# Patient Record
Sex: Male | Born: 1935 | Race: White | Hispanic: No | Marital: Single | Smoking: Never smoker
Health system: Southern US, Community
[De-identification: ages and names within clinical notes are randomized; demographics above are authoritative.]

## PROBLEM LIST (undated history)

## (undated) DIAGNOSIS — C801 Malignant (primary) neoplasm, unspecified: Secondary | ICD-10-CM

## (undated) DIAGNOSIS — H409 Unspecified glaucoma: Secondary | ICD-10-CM

## (undated) DIAGNOSIS — I10 Essential (primary) hypertension: Secondary | ICD-10-CM

## (undated) DIAGNOSIS — E119 Type 2 diabetes mellitus without complications: Secondary | ICD-10-CM

## (undated) HISTORY — PX: COSMETIC SURGERY: SHX468

---

## 2012-06-30 LAB — URINALYSIS, COMPLETE
Bilirubin,UR: NEGATIVE
Glucose,UR: NEGATIVE mg/dL (ref 0–75)
Leukocyte Esterase: NEGATIVE
Ph: 5 (ref 4.5–8.0)
Protein: NEGATIVE
Specific Gravity: 1.019 (ref 1.003–1.030)

## 2012-06-30 LAB — COMPREHENSIVE METABOLIC PANEL
Albumin: 3.3 g/dL — ABNORMAL LOW (ref 3.4–5.0)
Anion Gap: 4 — ABNORMAL LOW (ref 7–16)
Bilirubin,Total: 0.4 mg/dL (ref 0.2–1.0)
Calcium, Total: 8.4 mg/dL — ABNORMAL LOW (ref 8.5–10.1)
Chloride: 107 mmol/L (ref 98–107)
Co2: 26 mmol/L (ref 21–32)
EGFR (Non-African Amer.): >60
SGOT(AST): 18 U/L (ref 15–37)
Sodium: 137 mmol/L (ref 136–145)
Total Protein: 8.4 g/dL — ABNORMAL HIGH (ref 6.4–8.2)

## 2012-06-30 LAB — CBC
HGB: 10.2 g/dL — ABNORMAL LOW (ref 13.0–18.0)
MCH: 21.8 pg — ABNORMAL LOW (ref 26.0–34.0)
MCHC: 31.4 g/dL — ABNORMAL LOW (ref 32.0–36.0)
MCV: 69 fL — ABNORMAL LOW (ref 80–100)
RBC: 4.67 10*6/uL (ref 4.40–5.90)
RDW: 17.5 % — ABNORMAL HIGH (ref 11.5–14.5)
WBC: 9 10*3/uL (ref 3.8–10.6)

## 2012-06-30 LAB — CK TOTAL AND CKMB (NOT AT ARMC): CK, Total: 79 U/L (ref 35–232)

## 2012-06-30 LAB — PRO B NATRIURETIC PEPTIDE: B-Type Natriuretic Peptide: 1511 pg/mL — ABNORMAL HIGH (ref 0–450)

## 2012-06-30 LAB — TROPONIN I: Troponin-I: <0.02 ng/mL

## 2012-07-01 ENCOUNTER — Inpatient Hospital Stay: Payer: Self-pay | Admitting: Internal Medicine

## 2012-07-01 LAB — CBC WITH DIFFERENTIAL/PLATELET
Basophil %: 0.6 %
Eosinophil #: 0.1 10*3/uL (ref 0.0–0.7)
Eosinophil %: 1.2 %
HCT: 27.6 % — ABNORMAL LOW (ref 40.0–52.0)
HGB: 9.5 g/dL — ABNORMAL LOW (ref 13.0–18.0)
Lymphocyte #: 0.6 10*3/uL — ABNORMAL LOW (ref 1.0–3.6)
MCH: 23.7 pg — ABNORMAL LOW (ref 26.0–34.0)
MCHC: 34.4 g/dL (ref 32.0–36.0)
MCV: 69 fL — ABNORMAL LOW (ref 80–100)
Monocyte #: 0.8 x10 3/mm (ref 0.2–1.0)
Monocyte %: 9.2 %
Neutrophil #: 7.5 10*3/uL — ABNORMAL HIGH (ref 1.4–6.5)
Neutrophil %: 82.2 %
RBC: 4 10*6/uL — ABNORMAL LOW (ref 4.40–5.90)
RDW: 17.4 % — ABNORMAL HIGH (ref 11.5–14.5)

## 2012-07-01 LAB — BASIC METABOLIC PANEL
Anion Gap: 4 — ABNORMAL LOW (ref 7–16)
BUN: 13 mg/dL (ref 7–18)
Calcium, Total: 8.3 mg/dL — ABNORMAL LOW (ref 8.5–10.1)
Chloride: 106 mmol/L (ref 98–107)
Glucose: 142 mg/dL — ABNORMAL HIGH (ref 65–99)
Osmolality: 280 (ref 275–301)

## 2012-07-04 LAB — COMPREHENSIVE METABOLIC PANEL
Albumin: 2.4 g/dL — ABNORMAL LOW (ref 3.4–5.0)
Alkaline Phosphatase: 61 U/L (ref 50–136)
Bilirubin,Total: 0.2 mg/dL (ref 0.2–1.0)
Calcium, Total: 7.9 mg/dL — ABNORMAL LOW (ref 8.5–10.1)
Co2: 27 mmol/L (ref 21–32)
Creatinine: 0.71 mg/dL (ref 0.60–1.30)
EGFR (Non-African Amer.): >60
Potassium: 3.5 mmol/L (ref 3.5–5.1)
SGOT(AST): 14 U/L — ABNORMAL LOW (ref 15–37)
SGPT (ALT): 19 U/L (ref 12–78)
Sodium: 136 mmol/L (ref 136–145)
Total Protein: 6.9 g/dL (ref 6.4–8.2)

## 2012-07-04 LAB — CBC WITH DIFFERENTIAL/PLATELET
Basophil %: 0.7 %
Eosinophil #: 0.1 10*3/uL (ref 0.0–0.7)
Eosinophil %: 1.2 %
HGB: 8.4 g/dL — ABNORMAL LOW (ref 13.0–18.0)
Lymphocyte #: 0.8 10*3/uL — ABNORMAL LOW (ref 1.0–3.6)
Lymphocyte %: 8.8 %
MCV: 69 fL — ABNORMAL LOW (ref 80–100)
Monocyte %: 10.8 %
Neutrophil %: 78.5 %
Platelet: 176 10*3/uL (ref 150–440)
RBC: 3.93 10*6/uL — ABNORMAL LOW (ref 4.40–5.90)
RDW: 17.1 % — ABNORMAL HIGH (ref 11.5–14.5)
WBC: 9.2 10*3/uL (ref 3.8–10.6)

## 2012-07-05 LAB — CBC WITH DIFFERENTIAL/PLATELET
Basophil #: 0.1 10*3/uL (ref 0.0–0.1)
Basophil %: 1 %
Eosinophil #: 0 10*3/uL (ref 0.0–0.7)
Eosinophil %: 0.1 %
Lymphocyte %: 3 %
MCH: 21.5 pg — ABNORMAL LOW (ref 26.0–34.0)
MCHC: 31.5 g/dL — ABNORMAL LOW (ref 32.0–36.0)
MCV: 68 fL — ABNORMAL LOW (ref 80–100)
Monocyte #: 0.2 x10 3/mm (ref 0.2–1.0)
Monocyte %: 3.6 %
Neutrophil #: 5.9 10*3/uL (ref 1.4–6.5)
Neutrophil %: 92.3 %
Platelet: 200 10*3/uL (ref 150–440)
RBC: 4.28 10*6/uL — ABNORMAL LOW (ref 4.40–5.90)
WBC: 6.4 10*3/uL (ref 3.8–10.6)

## 2012-07-05 LAB — BASIC METABOLIC PANEL
Calcium, Total: 8.8 mg/dL (ref 8.5–10.1)
Chloride: 103 mmol/L (ref 98–107)
Creatinine: 0.74 mg/dL (ref 0.60–1.30)
EGFR (African American): >60
EGFR (Non-African Amer.): >60
Sodium: 137 mmol/L (ref 136–145)

## 2012-07-05 LAB — IRON AND TIBC: Unbound Iron-Bind.Cap.: 319 ug/dL

## 2012-07-06 LAB — CBC WITH DIFFERENTIAL/PLATELET
Basophil #: 0 10*3/uL (ref 0.0–0.1)
Basophil %: 0.3 %
Eosinophil #: 0 10*3/uL (ref 0.0–0.7)
Eosinophil %: 0.1 %
HCT: 29 % — ABNORMAL LOW (ref 40.0–52.0)
Lymphocyte %: 2.3 %
MCV: 69 fL — ABNORMAL LOW (ref 80–100)
Monocyte %: 5.6 %
Neutrophil #: 10 10*3/uL — ABNORMAL HIGH (ref 1.4–6.5)
Neutrophil %: 91.7 %
RBC: 4.23 10*6/uL — ABNORMAL LOW (ref 4.40–5.90)
RDW: 17.4 % — ABNORMAL HIGH (ref 11.5–14.5)
WBC: 10.9 10*3/uL — ABNORMAL HIGH (ref 3.8–10.6)

## 2012-07-06 LAB — BASIC METABOLIC PANEL
BUN: 21 mg/dL — ABNORMAL HIGH (ref 7–18)
Chloride: 105 mmol/L (ref 98–107)
Creatinine: 0.83 mg/dL (ref 0.60–1.30)
EGFR (African American): >60
EGFR (Non-African Amer.): >60
Potassium: 4 mmol/L (ref 3.5–5.1)

## 2012-07-07 LAB — BASIC METABOLIC PANEL
Anion Gap: 2 — ABNORMAL LOW (ref 7–16)
BUN: 20 mg/dL — ABNORMAL HIGH (ref 7–18)
Calcium, Total: 8.4 mg/dL — ABNORMAL LOW (ref 8.5–10.1)
Chloride: 104 mmol/L (ref 98–107)
Creatinine: 0.83 mg/dL (ref 0.60–1.30)
EGFR (Non-African Amer.): >60
Sodium: 139 mmol/L (ref 136–145)

## 2012-07-07 LAB — CBC WITH DIFFERENTIAL/PLATELET
Basophil #: 0 10*3/uL (ref 0.0–0.1)
Basophil %: 0.2 %
Eosinophil #: 0 10*3/uL (ref 0.0–0.7)
HGB: 8.6 g/dL — ABNORMAL LOW (ref 13.0–18.0)
MCH: 21.1 pg — ABNORMAL LOW (ref 26.0–34.0)
MCHC: 30.9 g/dL — ABNORMAL LOW (ref 32.0–36.0)
Monocyte #: 0.4 x10 3/mm (ref 0.2–1.0)
Monocyte %: 4.1 %
Neutrophil #: 9.8 10*3/uL — ABNORMAL HIGH (ref 1.4–6.5)
Neutrophil %: 93 %
Platelet: 269 10*3/uL (ref 150–440)
RDW: 16.7 % — ABNORMAL HIGH (ref 11.5–14.5)
WBC: 10.6 10*3/uL (ref 3.8–10.6)

## 2012-07-08 LAB — COMPREHENSIVE METABOLIC PANEL
Albumin: 2.4 g/dL — ABNORMAL LOW (ref 3.4–5.0)
Bilirubin,Total: 0.2 mg/dL (ref 0.2–1.0)
Calcium, Total: 8.3 mg/dL — ABNORMAL LOW (ref 8.5–10.1)
Co2: 34 mmol/L — ABNORMAL HIGH (ref 21–32)
Creatinine: 0.78 mg/dL (ref 0.60–1.30)
EGFR (African American): >60
Glucose: 227 mg/dL — ABNORMAL HIGH (ref 65–99)
Potassium: 3.9 mmol/L (ref 3.5–5.1)
SGOT(AST): 26 U/L (ref 15–37)
SGPT (ALT): 69 U/L (ref 12–78)
Sodium: 138 mmol/L (ref 136–145)
Total Protein: 6.5 g/dL (ref 6.4–8.2)

## 2012-07-08 LAB — IRON AND TIBC
Iron Bind.Cap.(Total): 325 ug/dL (ref 250–450)
Iron Saturation: 9 %

## 2012-07-08 LAB — CBC WITH DIFFERENTIAL/PLATELET
HCT: 27.8 % — ABNORMAL LOW (ref 40.0–52.0)
HGB: 8.8 g/dL — ABNORMAL LOW (ref 13.0–18.0)
Lymphocytes: 14 %
MCH: 21.4 pg — ABNORMAL LOW (ref 26.0–34.0)
MCV: 68 fL — ABNORMAL LOW (ref 80–100)
Platelet: 265 10*3/uL (ref 150–440)
RDW: 17.4 % — ABNORMAL HIGH (ref 11.5–14.5)
Segmented Neutrophils: 82 %
Variant Lymphocyte - H1-Rlymph: 1 %
WBC: 9.3 10*3/uL (ref 3.8–10.6)

## 2012-07-08 LAB — OCCULT BLOOD X 1 CARD TO LAB, STOOL: Occult Blood, Feces: NEGATIVE

## 2012-07-09 LAB — BASIC METABOLIC PANEL
Anion Gap: 4 — ABNORMAL LOW (ref 7–16)
BUN: 17 mg/dL (ref 7–18)
Calcium, Total: 8 mg/dL — ABNORMAL LOW (ref 8.5–10.1)
Co2: 32 mmol/L (ref 21–32)
Creatinine: 0.78 mg/dL (ref 0.60–1.30)
EGFR (African American): >60
EGFR (Non-African Amer.): >60
Glucose: 87 mg/dL (ref 65–99)
Osmolality: 277 (ref 275–301)
Sodium: 138 mmol/L (ref 136–145)

## 2012-07-10 LAB — UR PROT ELECTROPHORESIS, URINE RANDOM

## 2012-09-29 ENCOUNTER — Ambulatory Visit: Payer: Self-pay | Admitting: Internal Medicine

## 2012-11-21 ENCOUNTER — Ambulatory Visit: Payer: Self-pay | Admitting: Specialist

## 2012-12-04 ENCOUNTER — Ambulatory Visit: Payer: Self-pay | Admitting: Specialist

## 2012-12-11 ENCOUNTER — Ambulatory Visit: Payer: Self-pay | Admitting: Unknown Physician Specialty

## 2012-12-12 ENCOUNTER — Ambulatory Visit: Payer: Self-pay | Admitting: Unknown Physician Specialty

## 2012-12-13 ENCOUNTER — Ambulatory Visit: Payer: Self-pay | Admitting: Oncology

## 2012-12-13 LAB — PATHOLOGY REPORT

## 2012-12-25 ENCOUNTER — Ambulatory Visit: Payer: Self-pay | Admitting: Oncology

## 2013-01-08 LAB — CBC CANCER CENTER
Basophil %: 1.2 %
Eosinophil %: 12.1 %
HGB: 12.6 g/dL — ABNORMAL LOW (ref 13.0–18.0)
Lymphocyte %: 21.4 %
MCH: 28.1 pg (ref 26.0–34.0)
Monocyte #: 1 x10 3/mm (ref 0.2–1.0)
Monocyte %: 9.4 %
Neutrophil #: 5.9 x10 3/mm (ref 1.4–6.5)
Neutrophil %: 55.9 %
Platelet: 197 x10 3/mm (ref 150–440)
RDW: 14.3 % (ref 11.5–14.5)

## 2013-01-08 LAB — BASIC METABOLIC PANEL
Chloride: 105 mmol/L (ref 98–107)
Co2: 28 mmol/L (ref 21–32)
Creatinine: 1.15 mg/dL (ref 0.60–1.30)
EGFR (African American): >60
Glucose: 174 mg/dL — ABNORMAL HIGH (ref 65–99)
Osmolality: 288 (ref 275–301)

## 2013-01-08 LAB — MAGNESIUM: Magnesium: 2.3 mg/dL

## 2013-01-15 LAB — CBC CANCER CENTER
Eosinophil #: 1.2 x10 3/mm — ABNORMAL HIGH (ref 0.0–0.7)
Eosinophil %: 10.9 %
HCT: 40.6 % (ref 40.0–52.0)
HGB: 12.9 g/dL — ABNORMAL LOW (ref 13.0–18.0)
Lymphocyte #: 1.6 x10 3/mm (ref 1.0–3.6)
Lymphocyte %: 14.7 %
MCH: 27.7 pg (ref 26.0–34.0)
MCHC: 31.9 g/dL — ABNORMAL LOW (ref 32.0–36.0)
MCV: 87 fL (ref 80–100)
Monocyte #: 1.2 x10 3/mm — ABNORMAL HIGH (ref 0.2–1.0)
Neutrophil %: 62.7 %
Platelet: 260 x10 3/mm (ref 150–440)
RBC: 4.68 10*6/uL (ref 4.40–5.90)
RDW: 14.4 % (ref 11.5–14.5)
WBC: 11.2 x10 3/mm — ABNORMAL HIGH (ref 3.8–10.6)

## 2013-01-22 LAB — COMPREHENSIVE METABOLIC PANEL
Albumin: 3 g/dL — ABNORMAL LOW (ref 3.4–5.0)
Alkaline Phosphatase: 100 U/L (ref 50–136)
Anion Gap: 10 (ref 7–16)
BUN: 14 mg/dL (ref 7–18)
Bilirubin,Total: 0.2 mg/dL (ref 0.2–1.0)
Calcium, Total: 8.4 mg/dL — ABNORMAL LOW (ref 8.5–10.1)
Chloride: 105 mmol/L (ref 98–107)
EGFR (African American): >60
EGFR (Non-African Amer.): 59 — ABNORMAL LOW
Glucose: 134 mg/dL — ABNORMAL HIGH (ref 65–99)
Osmolality: 286 (ref 275–301)
Potassium: 3.6 mmol/L (ref 3.5–5.1)
SGOT(AST): 11 U/L — ABNORMAL LOW (ref 15–37)
SGPT (ALT): 23 U/L (ref 12–78)
Sodium: 142 mmol/L (ref 136–145)

## 2013-01-22 LAB — CBC CANCER CENTER
Basophil #: 0.1 x10 3/mm (ref 0.0–0.1)
Eosinophil #: 1.1 x10 3/mm — ABNORMAL HIGH (ref 0.0–0.7)
Lymphocyte %: 13.3 %
MCH: 27.7 pg (ref 26.0–34.0)
MCHC: 32.1 g/dL (ref 32.0–36.0)
MCV: 86 fL (ref 80–100)
Monocyte %: 8.6 %
Neutrophil #: 8 x10 3/mm — ABNORMAL HIGH (ref 1.4–6.5)
Neutrophil %: 68.3 %
RBC: 4.65 10*6/uL (ref 4.40–5.90)
WBC: 11.7 x10 3/mm — ABNORMAL HIGH (ref 3.8–10.6)

## 2013-01-24 ENCOUNTER — Ambulatory Visit: Payer: Self-pay | Admitting: Oncology

## 2013-02-06 LAB — MAGNESIUM: Magnesium: 1.8 mg/dL

## 2013-02-06 LAB — CBC CANCER CENTER
Basophil #: 0.1 x10 3/mm (ref 0.0–0.1)
Basophil %: 1.1 %
HCT: 40.4 % (ref 40.0–52.0)
MCH: 28.7 pg (ref 26.0–34.0)
Monocyte %: 8.8 %
Neutrophil #: 6.8 x10 3/mm — ABNORMAL HIGH (ref 1.4–6.5)
RBC: 4.65 10*6/uL (ref 4.40–5.90)
RDW: 14.2 % (ref 11.5–14.5)

## 2013-02-06 LAB — COMPREHENSIVE METABOLIC PANEL
Albumin: 2.9 g/dL — ABNORMAL LOW (ref 3.4–5.0)
Alkaline Phosphatase: 131 U/L (ref 50–136)
BUN: 13 mg/dL (ref 7–18)
Chloride: 104 mmol/L (ref 98–107)
Glucose: 211 mg/dL — ABNORMAL HIGH (ref 65–99)
Osmolality: 288 (ref 275–301)
Potassium: 3.7 mmol/L (ref 3.5–5.1)
SGOT(AST): 12 U/L — ABNORMAL LOW (ref 15–37)
SGPT (ALT): 19 U/L (ref 12–78)
Total Protein: 7.3 g/dL (ref 6.4–8.2)

## 2013-02-24 ENCOUNTER — Ambulatory Visit: Payer: Self-pay | Admitting: Oncology

## 2013-03-14 LAB — CBC CANCER CENTER
Basophil #: 0.1 x10 3/mm (ref 0.0–0.1)
Eosinophil #: 1.1 x10 3/mm — ABNORMAL HIGH (ref 0.0–0.7)
Eosinophil %: 13.7 %
HCT: 42.9 % (ref 40.0–52.0)
HGB: 13.8 g/dL (ref 13.0–18.0)
Lymphocyte #: 1 x10 3/mm (ref 1.0–3.6)
Lymphocyte %: 12.2 %
MCH: 27.5 pg (ref 26.0–34.0)
MCHC: 32.2 g/dL (ref 32.0–36.0)
Monocyte %: 8.3 %
Neutrophil #: 5.2 x10 3/mm (ref 1.4–6.5)
Neutrophil %: 64.5 %
Platelet: 203 x10 3/mm (ref 150–440)
RBC: 5.03 10*6/uL (ref 4.40–5.90)
WBC: 8.1 x10 3/mm (ref 3.8–10.6)

## 2013-03-14 LAB — COMPREHENSIVE METABOLIC PANEL
Albumin: 3 g/dL — ABNORMAL LOW (ref 3.4–5.0)
BUN: 11 mg/dL (ref 7–18)
Bilirubin,Total: 0.2 mg/dL (ref 0.2–1.0)
Calcium, Total: 8.9 mg/dL (ref 8.5–10.1)
Chloride: 104 mmol/L (ref 98–107)
Creatinine: 1.18 mg/dL (ref 0.60–1.30)
EGFR (African American): >60
EGFR (Non-African Amer.): 56 — ABNORMAL LOW
Osmolality: 284 (ref 275–301)
Potassium: 3.8 mmol/L (ref 3.5–5.1)
SGOT(AST): 13 U/L — ABNORMAL LOW (ref 15–37)
SGPT (ALT): 20 U/L (ref 12–78)
Sodium: 140 mmol/L (ref 136–145)
Total Protein: 7.4 g/dL (ref 6.4–8.2)

## 2013-03-14 LAB — MAGNESIUM: Magnesium: 2 mg/dL

## 2013-03-26 ENCOUNTER — Ambulatory Visit: Payer: Self-pay | Admitting: Oncology

## 2013-04-30 ENCOUNTER — Ambulatory Visit: Payer: Self-pay | Admitting: Oncology

## 2013-05-02 ENCOUNTER — Ambulatory Visit: Payer: Self-pay | Admitting: Oncology

## 2013-05-02 LAB — COMPREHENSIVE METABOLIC PANEL
ANION GAP: 12 (ref 7–16)
AST: 14 U/L — AB (ref 15–37)
Albumin: 3.6 g/dL (ref 3.4–5.0)
Alkaline Phosphatase: 86 U/L
BILIRUBIN TOTAL: 0.4 mg/dL (ref 0.2–1.0)
BUN: 14 mg/dL (ref 7–18)
Calcium, Total: 9.3 mg/dL (ref 8.5–10.1)
Chloride: 103 mmol/L (ref 98–107)
Co2: 24 mmol/L (ref 21–32)
Creatinine: 1.2 mg/dL (ref 0.60–1.30)
EGFR (African American): >60
GFR CALC NON AF AMER: 54 — AB
GLUCOSE: 190 mg/dL — AB (ref 65–99)
Osmolality: 283 (ref 275–301)
Potassium: 3.8 mmol/L (ref 3.5–5.1)
SGPT (ALT): 26 U/L (ref 12–78)
SODIUM: 139 mmol/L (ref 136–145)
Total Protein: 8 g/dL (ref 6.4–8.2)

## 2013-05-02 LAB — TSH: Thyroid Stimulating Horm: 3.29 u[IU]/mL

## 2013-05-02 LAB — CBC CANCER CENTER
BASOS ABS: 0.1 x10 3/mm (ref 0.0–0.1)
Basophil %: 1.4 %
EOS ABS: 1 x10 3/mm — AB (ref 0.0–0.7)
EOS PCT: 12.6 %
HCT: 45.5 % (ref 40.0–52.0)
HGB: 14.6 g/dL (ref 13.0–18.0)
LYMPHS ABS: 1.4 x10 3/mm (ref 1.0–3.6)
Lymphocyte %: 16.9 %
MCH: 27.5 pg (ref 26.0–34.0)
MCHC: 32 g/dL (ref 32.0–36.0)
MCV: 86 fL (ref 80–100)
Monocyte #: 0.6 x10 3/mm (ref 0.2–1.0)
Monocyte %: 7.2 %
NEUTROS ABS: 5 x10 3/mm (ref 1.4–6.5)
Neutrophil %: 61.9 %
Platelet: 187 x10 3/mm (ref 150–440)
RBC: 5.3 10*6/uL (ref 4.40–5.90)
RDW: 15.9 % — ABNORMAL HIGH (ref 11.5–14.5)
WBC: 8.1 x10 3/mm (ref 3.8–10.6)

## 2013-05-27 ENCOUNTER — Ambulatory Visit: Payer: Self-pay | Admitting: Oncology

## 2013-08-31 ENCOUNTER — Ambulatory Visit: Payer: Self-pay | Admitting: Oncology

## 2013-10-31 ENCOUNTER — Ambulatory Visit: Payer: Self-pay | Admitting: Hematology and Oncology

## 2013-11-07 LAB — CBC CANCER CENTER
BASOS ABS: 0.1 x10 3/mm (ref 0.0–0.1)
Basophil %: 0.9 %
EOS ABS: 1 x10 3/mm — AB (ref 0.0–0.7)
EOS PCT: 12.5 %
HCT: 40.6 % (ref 40.0–52.0)
HGB: 13.2 g/dL (ref 13.0–18.0)
LYMPHS PCT: 12.8 %
Lymphocyte #: 1.1 x10 3/mm (ref 1.0–3.6)
MCH: 28.9 pg (ref 26.0–34.0)
MCHC: 32.5 g/dL (ref 32.0–36.0)
MCV: 89 fL (ref 80–100)
Monocyte #: 0.7 x10 3/mm (ref 0.2–1.0)
Monocyte %: 7.8 %
NEUTROS ABS: 5.5 x10 3/mm (ref 1.4–6.5)
NEUTROS PCT: 66 %
PLATELETS: 230 x10 3/mm (ref 150–440)
RBC: 4.58 10*6/uL (ref 4.40–5.90)
RDW: 13.8 % (ref 11.5–14.5)
WBC: 8.3 x10 3/mm (ref 3.8–10.6)

## 2013-11-07 LAB — COMPREHENSIVE METABOLIC PANEL
ALBUMIN: 3.3 g/dL — AB (ref 3.4–5.0)
Alkaline Phosphatase: 69 U/L
Anion Gap: 10 (ref 7–16)
BILIRUBIN TOTAL: 0.3 mg/dL (ref 0.2–1.0)
BUN: 20 mg/dL — ABNORMAL HIGH (ref 7–18)
CHLORIDE: 107 mmol/L (ref 98–107)
Calcium, Total: 9 mg/dL (ref 8.5–10.1)
Co2: 26 mmol/L (ref 21–32)
Creatinine: 1.36 mg/dL — ABNORMAL HIGH (ref 0.60–1.30)
EGFR (African American): 54 — ABNORMAL LOW
EGFR (Non-African Amer.): 47 — ABNORMAL LOW
Glucose: 155 mg/dL — ABNORMAL HIGH (ref 65–99)
Osmolality: 291 (ref 275–301)
Potassium: 4.1 mmol/L (ref 3.5–5.1)
SGOT(AST): 17 U/L (ref 15–37)
SGPT (ALT): 23 U/L (ref 12–78)
SODIUM: 143 mmol/L (ref 136–145)
TOTAL PROTEIN: 7.3 g/dL (ref 6.4–8.2)

## 2013-11-07 LAB — TSH: Thyroid Stimulating Horm: 4.87 u[IU]/mL — ABNORMAL HIGH

## 2013-11-24 ENCOUNTER — Ambulatory Visit: Payer: Self-pay | Admitting: Hematology and Oncology

## 2013-12-05 ENCOUNTER — Inpatient Hospital Stay (HOSPITAL_COMMUNITY)
Admission: EM | Admit: 2013-12-05 | Discharge: 2013-12-06 | DRG: 087 | Disposition: A | Discharge status: Home / Self Care | Payer: Medicare Other | Attending: Internal Medicine | Admitting: Internal Medicine

## 2013-12-05 ENCOUNTER — Encounter (HOSPITAL_COMMUNITY): Payer: Self-pay | Admitting: Emergency Medicine

## 2013-12-05 ENCOUNTER — Ambulatory Visit: Payer: Self-pay | Admitting: Internal Medicine

## 2013-12-05 DIAGNOSIS — E119 Type 2 diabetes mellitus without complications: Secondary | ICD-10-CM

## 2013-12-05 DIAGNOSIS — I10 Essential (primary) hypertension: Secondary | ICD-10-CM

## 2013-12-05 DIAGNOSIS — I6200 Nontraumatic subdural hemorrhage, unspecified: Secondary | ICD-10-CM

## 2013-12-05 DIAGNOSIS — Z79899 Other long term (current) drug therapy: Secondary | ICD-10-CM

## 2013-12-05 DIAGNOSIS — R269 Unspecified abnormalities of gait and mobility: Secondary | ICD-10-CM | POA: Diagnosis present

## 2013-12-05 DIAGNOSIS — S065X9A Traumatic subdural hemorrhage with loss of consciousness of unspecified duration, initial encounter: Secondary | ICD-10-CM

## 2013-12-05 DIAGNOSIS — Y92009 Unspecified place in unspecified non-institutional (private) residence as the place of occurrence of the external cause: Secondary | ICD-10-CM

## 2013-12-05 DIAGNOSIS — W19XXXA Unspecified fall, initial encounter: Secondary | ICD-10-CM

## 2013-12-05 DIAGNOSIS — W010XXA Fall on same level from slipping, tripping and stumbling without subsequent striking against object, initial encounter: Secondary | ICD-10-CM | POA: Diagnosis present

## 2013-12-05 DIAGNOSIS — S065XAA Traumatic subdural hemorrhage with loss of consciousness status unknown, initial encounter: Secondary | ICD-10-CM

## 2013-12-05 DIAGNOSIS — H409 Unspecified glaucoma: Secondary | ICD-10-CM

## 2013-12-05 DIAGNOSIS — Z66 Do not resuscitate: Secondary | ICD-10-CM | POA: Diagnosis present

## 2013-12-05 DIAGNOSIS — S065X0A Traumatic subdural hemorrhage without loss of consciousness, initial encounter: Principal | ICD-10-CM | POA: Diagnosis present

## 2013-12-05 DIAGNOSIS — E1139 Type 2 diabetes mellitus with other diabetic ophthalmic complication: Secondary | ICD-10-CM

## 2013-12-05 DIAGNOSIS — R51 Headache: Secondary | ICD-10-CM | POA: Diagnosis present

## 2013-12-05 HISTORY — DX: Unspecified glaucoma: H40.9

## 2013-12-05 HISTORY — DX: Type 2 diabetes mellitus without complications: E11.9

## 2013-12-05 HISTORY — DX: Essential (primary) hypertension: I10

## 2013-12-05 LAB — COMPREHENSIVE METABOLIC PANEL
ALK PHOS: 72 U/L (ref 39–117)
ALT: 13 U/L (ref 0–53)
ANION GAP: 12 (ref 5–15)
AST: 14 U/L (ref 0–37)
Albumin: 3.1 g/dL — ABNORMAL LOW (ref 3.5–5.2)
BUN: 18 mg/dL (ref 6–23)
CHLORIDE: 104 meq/L (ref 96–112)
CO2: 24 mEq/L (ref 19–32)
Calcium: 8.8 mg/dL (ref 8.4–10.5)
Creatinine, Ser: 1.15 mg/dL (ref 0.50–1.35)
GFR calc non Af Amer: 56 mL/min — ABNORMAL LOW (ref >90)
GFR, EST AFRICAN AMERICAN: 65 mL/min — AB (ref >90)
Glucose, Bld: 146 mg/dL — ABNORMAL HIGH (ref 70–99)
POTASSIUM: 3.9 meq/L (ref 3.7–5.3)
Sodium: 140 mEq/L (ref 137–147)
Total Protein: 6.6 g/dL (ref 6.0–8.3)

## 2013-12-05 LAB — GLUCOSE, CAPILLARY: Glucose-Capillary: 168 mg/dL — ABNORMAL HIGH (ref 70–99)

## 2013-12-05 LAB — PROTIME-INR
INR: 1.05 (ref 0.00–1.49)
Prothrombin Time: 13.7 seconds (ref 11.6–15.2)

## 2013-12-05 MED ORDER — BETAXOLOL HCL 0.5 % OP SOLN
1.0000 [drp] | Freq: Every day | OPHTHALMIC | Status: DC
  Filled 2013-12-05: qty 5

## 2013-12-05 MED ORDER — VERAPAMIL HCL ER 240 MG PO TBCR
240.0000 mg | EXTENDED_RELEASE_TABLET | Freq: Every day | ORAL | Status: DC
  Administered 2013-12-05: 240 mg via ORAL
  Filled 2013-12-05 (×3): qty 1

## 2013-12-05 MED ORDER — ATORVASTATIN CALCIUM 10 MG PO TABS: 10.0000 mg | ORAL_TABLET | Freq: Every day | ORAL | Status: DC

## 2013-12-05 MED ORDER — ACETAMINOPHEN 325 MG PO TABS: 650.0000 mg | ORAL_TABLET | Freq: Four times a day (QID) | ORAL | Status: DC | PRN

## 2013-12-05 MED ORDER — NICARDIPINE HCL IN NACL 20-0.86 MG/200ML-% IV SOLN
5.0000 mg/h | Freq: Once | INTRAVENOUS | Status: DC
  Filled 2013-12-05: qty 200

## 2013-12-05 MED ORDER — FERROUS SULFATE 325 (65 FE) MG PO TABS: 325.0000 mg | ORAL_TABLET | Freq: Every day | ORAL | Status: DC

## 2013-12-05 MED ORDER — SIMVASTATIN 20 MG PO TABS: 20.0000 mg | ORAL_TABLET | Freq: Every day | ORAL | Status: DC

## 2013-12-05 MED ORDER — HYDRALAZINE HCL 20 MG/ML IJ SOLN: 10.0000 mg | Freq: Four times a day (QID) | INTRAMUSCULAR | Status: DC | PRN

## 2013-12-05 MED ORDER — LABETALOL HCL 5 MG/ML IV SOLN
20.0000 mg | Freq: Once | INTRAVENOUS | Status: AC
  Administered 2013-12-05: 20 mg via INTRAVENOUS
  Filled 2013-12-05: qty 4

## 2013-12-05 MED ORDER — INSULIN ASPART 100 UNIT/ML ~~LOC~~ SOLN: 0.0000 [IU] | Freq: Three times a day (TID) | SUBCUTANEOUS | Status: DC

## 2013-12-05 MED ORDER — PANTOPRAZOLE SODIUM 40 MG PO TBEC: 40.0000 mg | DELAYED_RELEASE_TABLET | Freq: Every day | ORAL | Status: DC

## 2013-12-05 MED ORDER — HYDRALAZINE HCL 20 MG/ML IJ SOLN: 5.0000 mg | Freq: Four times a day (QID) | INTRAMUSCULAR | Status: DC | PRN

## 2013-12-05 MED ORDER — LOSARTAN POTASSIUM 50 MG PO TABS
50.0000 mg | ORAL_TABLET | Freq: Every day | ORAL | Status: DC
Start: 2013-12-06 — End: 2013-12-06

## 2013-12-05 MED ORDER — ONDANSETRON HCL 4 MG/2ML IJ SOLN: 4.0000 mg | Freq: Four times a day (QID) | INTRAMUSCULAR | Status: DC | PRN

## 2013-12-05 NOTE — ED Notes (Signed)
Pt from home. Fell 3 weeks ago and never went to PCP. Had a PCP appt this am and was sent to [facility] for a CT of the Head. MD office called him this afternoon and told him he has subdural hematoma from his fall and to come to the ER for further evaluation. Pt is legally blind in both eyes from glaucoma and is being treated at [facility] for it. Pt alert and oriented x 4, neuro intact.

## 2013-12-05 NOTE — ED Provider Notes (Signed)
CSN: [id number]     Arrival date & time 12/05/13  1505 History   First MD Initiated Contact with Patient 12/05/13 1515     Chief Complaint  Patient presents with  . Bleeding/Bruising    subdural hematoma     (Consider location/radiation/quality/duration/timing/severity/associated sxs/prior Treatment) HPI Comments: 80 year old male legally blind, glaucoma, diabetes, high blood pressure presents sent in from primary Dr. after CT at [facility] red subdural 16 mm acute appearing left superior frontal region causing mass effect but no midline shift, mild atrophy. CT results reviewed on report.  Patient says he fell 2 and half weeks ago mechanical fall hit the back of his head on a chair. He was seeing his physician for routine followup, denies any new symptoms. No neuro symptoms except chronic blindness. No vomiting. No history of bleed and no blood thinners.  The history is provided by the patient.    Past Medical History  Diagnosis Date  . Hypertension   . Diabetes mellitus without complication   . Glaucoma     both eyes   Past Surgical History  Procedure Laterality Date  . Cosmetic surgery     No family history on file. History  Substance Use Topics  . Smoking status: Not on file  . Smokeless tobacco: Not on file  . Alcohol Use: No    Review of Systems  Constitutional: Negative for fever and chills.  HENT: Negative for congestion.   Eyes: Positive for visual disturbance (chronic).  Respiratory: Negative for shortness of breath.   Cardiovascular: Negative for chest pain.  Gastrointestinal: Negative for vomiting and abdominal pain.  Genitourinary: Negative for dysuria and flank pain.  Musculoskeletal: Negative for back pain, neck pain and neck stiffness.  Skin: Negative for rash.  Neurological: Negative for light-headedness and headaches.      Allergies  Review of patient's allergies indicates not on file.  Home Medications   Prior to Admission medications   Not on  File   BP 176/56  Pulse 62  Temp(Src) 98.3 F (36.8 C) (Oral)  Resp 8  Ht 6' (1.829 m)  Wt 182 lb (82.555 kg)  BMI 24.68 kg/m2  SpO2 100% Physical Exam  Nursing note and vitals reviewed. Constitutional: He is oriented to person, place, and time. He appears well-developed and well-nourished.  HENT:  Head: Normocephalic and atraumatic.  Eyes: Conjunctivae are normal. Right eye exhibits no discharge. Left eye exhibits no discharge.  Neck: Normal range of motion. Neck supple. No tracheal deviation present.  Cardiovascular: Normal rate and regular rhythm.   Pulmonary/Chest: Effort normal and breath sounds normal.  Abdominal: Soft. He exhibits no distension. There is no tenderness. There is no guarding.  Musculoskeletal: He exhibits no edema.  Neurological: He is alert and oriented to person, place, and time.  5+ strength in UE and LE with f/e at major joints. Sensation to palpation intact in UE and LE.   PERRL.  horizontal eye movements intact, eyes wandering is patient chronically blind.  coordination intact bilateral.   Neck supple full range of motion nontender.    Skin: Skin is warm. No rash noted.  Psychiatric: He has a normal mood and affect.    ED Course  Procedures (including critical care time) Labs Review Labs Reviewed  COMPREHENSIVE METABOLIC PANEL - Abnormal; Notable for the following:    Glucose, Bld 146 (*)    Albumin 3.1 (*)    Total Bilirubin <0.2 (*)    GFR calc non Af Amer 56 (*)  GFR calc Af Amer 65 (*)    All other components within normal limits  PROTIME-INR  CBC WITH DIFFERENTIAL    Imaging Review No results found.   EKG Interpretation None      MDM   Final diagnoses:  Fall, initial encounter  Acute subdural hematoma      in patient with subdural likely from fallto half weeks ago, CT results reviewed. Case discussed with Dr. Annette Stable neurosurgery who recommended admission/observation via medicine and repeat CT head tomorrow. Patient neuro  intact except chronic blindness in ER. No blood thinners. Vitals unremarkable except for elevated blood pressure, plan for labetalol to keep less than 409 systolic.   The patients results and plan were reviewed and discussed.   Any x-rays performed were personally reviewed by myself.   Differential diagnosis were considered with the presenting HPI.  Medications  labetalol (NORMODYNE,TRANDATE) injection 20 mg (not administered)     Filed Vitals:   12/05/13 1511 12/05/13 1515 12/05/13 1521 12/05/13 1530  BP:  182/53 182/53 176/56  Pulse:  66 75 62  Temp:   98.3 F (36.8 C)   TempSrc:   Oral   Resp:  10 14 8   Height:   6' (1.829 m)   Weight:   182 lb (82.555 kg)   SpO2: 100% 99% 99% 100%    Admission/ observation were discussed with the admitting physician, patient and/or family and they are comfortable with the plan.    Mariea Clonts, MD 12/05/13 [phone]

## 2013-12-05 NOTE — H&P (Signed)
[facility] History and Physical  Cody Pratt:[id number] DOB: 1924-10-27 DOA: 12/05/2013  Referring physician: EDP PCP: No primary provider on file.   Chief Complaint: abnormal CT head  HPI: Cody Pratt is a 80 y.o. male with PMH of DM, HTN, glaucoma/Blindness, suffered a mechanical fall 3 weeks ago where he hit his head, he recalls tripping on something. Did not lose consciousness after the fall, did not have any headaches, seizures, weakness/numbness etc since then. His niece and nephew who are at bedside report some involuntary movements involving his legs recently which the patient hasnt noticed. He has chronic gait disorder unchanged recently. He went to see his PCP on Monday for a physical, then had a CT head done today at [place] due to the fall which showed a 9mm acute appearing left superior frontal region causing mass effect but no midline shift, mild atrophy, he was then asked to come to the ER. EDP d/w Neurosurgery Dr.Poole who recommended overnight observation per TR[facility] and FU CT head in am and goal BP<14mmHG  Review of Systems:  Constitutional: 12 system review negative except for chronic gait disorder No weight loss, night sweats, Fevers, chills, fatigue.  HEENT:  No headaches, Difficulty swallowing,Tooth/dental problems,Sore throat,  No sneezing, itching, ear ache, nasal congestion, post nasal drip,  Cardio-vascular:  No chest pain, Orthopnea, PND, swelling in lower extremities, anasarca, dizziness, palpitations  GI:  No heartburn, indigestion, abdominal pain, nausea, vomiting, diarrhea, change in bowel habits, loss of appetite  Resp:  No shortness of breath with exertion or at rest. No excess mucus, no productive cough, No non-productive cough, No coughing up of blood.No change in color of mucus.No wheezing.No chest wall deformity  Skin:  no rash or lesions.  GU:  no dysuria, change in color of urine, no urgency or frequency. No flank pain.    Musculoskeletal:  No joint pain or swelling. No decreased range of motion. No back pain.  Psych:  No change in mood or affect. No depression or anxiety. No memory loss.   Past Medical History  Diagnosis Date  . Hypertension   . Diabetes mellitus without complication   . Glaucoma     both eyes   Past Surgical History  Procedure Laterality Date  . Cosmetic surgery     Social History:  reports that he does not drink alcohol. His tobacco and drug histories are not on file.  No Known Allergies  No family history on file.   Prior to Admission medications   Medication Sig Start Date End Date Taking? Authorizing Provider  betaxolol (BETOPTIC-S) 0.5 % ophthalmic suspension Place 1 drop into both eyes daily.   Yes Historical Provider, MD  clindamycin (CLINDAGEL) 1 % gel Apply 1 application topically daily. 12/03/13  Yes Historical Provider, MD  ferrous sulfate 325 (65 FE) MG tablet Take 325 mg by mouth daily with breakfast.   Yes Historical Provider, MD  glimepiride (AMARYL) 4 MG tablet Take 4 mg by mouth daily.   Yes Historical Provider, MD  losartan (COZAAR) 50 MG tablet Take 50 mg by mouth daily.   Yes Historical Provider, MD  lovastatin (MEVACOR) 40 MG tablet Take 40 mg by mouth daily.   Yes Historical Provider, MD  neomycin-polymyxin b-dexamethasone (MAXITROL) 3.5-10000-0.1 SUSP Place 1 drop into both eyes daily.   Yes Historical Provider, MD  pantoprazole (PROTONIX) 40 MG tablet Take 40 mg by mouth daily.   Yes Historical Provider, MD  verapamil (CALAN-SR) 240 MG CR tablet Take 240 mg  by mouth at bedtime.   Yes Historical Provider, MD   Physical Exam: Filed Vitals:   12/05/13 1530 12/05/13 1545 12/05/13 1600 12/05/13 1630  BP: 176/56 168/81 157/74 160/56  Pulse: 62 60 62 57  Temp:      TempSrc:      Resp: 8 15 14 11   Height:      Weight:      SpO2: 100% 100% 100% 100%    Wt Readings from Last 3 Encounters:  12/05/13 82.555 kg (182 lb)    General:  Appears calm and  comfortable, AAOx3 no distress Eyes: PERRL, normal lids, irises & conjunctiva, blind ENT: grossly normal  lips & tongue Neck: no LAD, masses or thyromegaly Cardiovascular: RRR, no m/r/g. No LE edema. Respiratory: CTA bilaterally, no w/r/r. Normal respiratory effort. Abdomen: soft, Nt, BS present Skin: no rash or induration seen on limited exam Musculoskeletal: 1 plus edema  Psychiatric: grossly normal mood and affect, speech fluent and appropriate Neurologic: moves all extremities, cranial nerves 2-12 intact, motor 5/5 in all extremities, sensory light touch intact, DTR 1plus, plantars mute          Labs on Admission:  Basic Metabolic Panel:  Recent Labs Lab 12/05/13 1528  NA 140  K 3.9  CL 104  CO2 24  GLUCOSE 146*  BUN 18  CREATININE 1.15  CALCIUM 8.8   Liver Function Tests:  Recent Labs Lab 12/05/13 1528  AST 14  ALT 13  ALKPHOS 72  BILITOT <0.2*  PROT 6.6  ALBUMIN 3.1*   No results found for this basename: LIPASE, AMYLASE,  in the last 168 hours No results found for this basename: AMMONIA,  in the last 168 hours CBC: No results found for this basename: WBC, NEUTROABS, HGB, HCT, MCV, PLT,  in the last 168 hours Cardiac Enzymes: No results found for this basename: CKTOTAL, CKMB, CKMBINDEX, TROPONINI,  in the last 168 hours  BNP (last 3 results) No results found for this basename: PROBNP,  in the last 8760 hours CBG: No results found for this basename: GLUCAP,  in the last 168 hours  Radiological Exams on Admission: No results found.  Assessment/Plan Principal Problem:   Subdural hematoma -s/p Fall about 3 weeks ago -EDP d/w Dr.Pool Neurosurgeon, recommended FU CT head in am and BP control for goal <160, nor surgical recommendation at this time. -asymptomatic at this time   DM -hold amaryl, SSI   HTN -stable, continue losartan and verapamil -hydralazine PRN   Glaucoma -continue eye drops  DVT proph: SCDs  Code Status: DNR DVT Prophylaxis:  SCDs Family Communication: d/w niece at bedside Disposition Plan: home tomorrow if stable  Time spent: 2min  Cody Pratt [facility] Pager [phone]  **Disclaimer: This note may have been dictated with voice recognition software. Similar sounding words can inadvertently be transcribed and this note may contain transcription errors which may not have been corrected upon publication of note.**

## 2013-12-05 NOTE — Progress Notes (Signed)
Report received from ED RN Maudie Mercury. All questions answered. Pt coming to room 4N09.

## 2013-12-05 NOTE — Consult Note (Signed)
Reason for Consult: Subdural hematoma Referring Physician: Emergency department  Cody Pratt is an 80 y.o. male.  HPI: 80 year old male with Pratt history of Pratt fall approximately 3 weeks ago. Patient with some mild headache. Evaluated in the emergency department today. Head CT scan demonstrates left frontal subdural hematoma with mild cortical mass effect. Patient without history of numbness weakness seizure or other neurologic problem.  Past Medical History  Diagnosis Date  . Hypertension   . Diabetes mellitus without complication   . Glaucoma     both eyes    Past Surgical History  Procedure Laterality Date  . Cosmetic surgery      No family history on file.  Social History:  reports that he does not drink alcohol. His tobacco and drug histories are not on file.  Allergies: No Known Allergies  Medications: I have reviewed the patient's current medications.  Results for orders placed during the hospital encounter of 12/05/13 (from the past 48 hour(s))  COMPREHENSIVE METABOLIC PANEL     Status: Abnormal   Collection Time    12/05/13  3:28 PM      Result Value Ref Range   Sodium 140  137 - 147 mEq/L   Potassium 3.9  3.7 - 5.3 mEq/L   Chloride 104  96 - 112 mEq/L   CO2 24  19 - 32 mEq/L   Glucose, Bld 146 (*) 70 - 99 mg/dL   BUN 18  6 - 23 mg/dL   Creatinine, Ser 1.15  0.50 - 1.35 mg/dL   Calcium 8.8  8.4 - 10.5 mg/dL   Total Protein 6.6  6.0 - 8.3 g/dL   Albumin 3.1 (*) 3.5 - 5.2 g/dL   AST 14  0 - 37 U/L   ALT 13  0 - 53 U/L   Alkaline Phosphatase 72  39 - 117 U/L   Total Bilirubin <0.2 (*) 0.3 - 1.2 mg/dL   GFR calc non Af Amer 56 (*) >90 mL/min   GFR calc Af Amer 65 (*) >90 mL/min   Comment: (NOTE)     The eGFR has been calculated using the CKD EPI equation.     This calculation has not been validated in all clinical situations.     eGFR's persistently <90 mL/min signify possible Chronic Kidney     Disease.   Anion gap 12  5 - 15  PROTIME-INR     Status: None    Collection Time    12/05/13  3:30 PM      Result Value Ref Range   Prothrombin Time 13.7  11.6 - 15.2 seconds   INR 1.05  0.00 - 1.49  GLUCOSE, CAPILLARY     Status: Abnormal   Collection Time    12/05/13  9:11 PM      Result Value Ref Range   Glucose-Capillary 168 (*) 70 - 99 mg/dL   Comment 1 Documented in Chart     Comment 2 Notify RN      No results found.  Pertinent items are noted in HPI. Blood pressure 153/54, pulse 59, temperature 97.5 F (36.4 C), temperature source Oral, resp. rate 16, height 6' (1.829 m), weight 82.555 kg (182 lb), SpO2 100.00%. The patient is awake and alert. Oriented and appropriate. He is blind in both eyes. Speech is fluent. Examination of head ears eyes others unremarkable. Neck is supple with full active range of motion. Airways midline. Chest and abdomen are benign. Extremities free from injury deformity. Neurologically as stated above  he is awake and alert. Oriented and appropriate. Cranial nerve function is intact except for revision bilaterally. Motor 5/5 bilaterally. No pronator drift.  Assessment/Plan: Focal left frontal subdural hematoma with minimal mass effect and no appreciable neurologic symptoms. Recommend followup head CT scan in morning. If stable to may be discharged home with followup with me as an outpatient.  Cody Pratt 12/05/2013, 10:12 PM

## 2013-12-06 ENCOUNTER — Inpatient Hospital Stay (HOSPITAL_COMMUNITY): Payer: Medicare Other

## 2013-12-06 ENCOUNTER — Encounter (HOSPITAL_COMMUNITY): Payer: Self-pay

## 2013-12-06 DIAGNOSIS — I10 Essential (primary) hypertension: Secondary | ICD-10-CM

## 2013-12-06 DIAGNOSIS — H409 Unspecified glaucoma: Secondary | ICD-10-CM

## 2013-12-06 DIAGNOSIS — E119 Type 2 diabetes mellitus without complications: Secondary | ICD-10-CM

## 2013-12-06 LAB — CBC
HCT: 39.9 % (ref 39.0–52.0)
HEMOGLOBIN: 12.8 g/dL — AB (ref 13.0–17.0)
MCH: 28.3 pg (ref 26.0–34.0)
MCHC: 32.1 g/dL (ref 30.0–36.0)
MCV: 88.3 fL (ref 78.0–100.0)
PLATELETS: 181 10*3/uL (ref 150–400)
RBC: 4.52 MIL/uL (ref 4.22–5.81)
RDW: 13.2 % (ref 11.5–15.5)
WBC: 7.3 10*3/uL (ref 4.0–10.5)

## 2013-12-06 LAB — GLUCOSE, CAPILLARY: Glucose-Capillary: 118 mg/dL — ABNORMAL HIGH (ref 70–99)

## 2013-12-06 LAB — COMPREHENSIVE METABOLIC PANEL
ALK PHOS: 73 U/L (ref 39–117)
ALT: 12 U/L (ref 0–53)
AST: 12 U/L (ref 0–37)
Albumin: 3 g/dL — ABNORMAL LOW (ref 3.5–5.2)
Anion gap: 10 (ref 5–15)
BUN: 17 mg/dL (ref 6–23)
CALCIUM: 8.8 mg/dL (ref 8.4–10.5)
CHLORIDE: 105 meq/L (ref 96–112)
CO2: 24 meq/L (ref 19–32)
Creatinine, Ser: 0.93 mg/dL (ref 0.50–1.35)
GFR calc Af Amer: 86 mL/min — ABNORMAL LOW (ref >90)
GFR, EST NON AFRICAN AMERICAN: 74 mL/min — AB (ref >90)
Glucose, Bld: 134 mg/dL — ABNORMAL HIGH (ref 70–99)
POTASSIUM: 4.2 meq/L (ref 3.7–5.3)
SODIUM: 139 meq/L (ref 137–147)
Total Bilirubin: 0.3 mg/dL (ref 0.3–1.2)
Total Protein: 6.6 g/dL (ref 6.0–8.3)

## 2013-12-06 NOTE — Progress Notes (Signed)
Overall stable. Denies headache. Neurologically unchanged. Followup head CT scan stable.  Subacute left convexity subdural hematoma. Patient may be discharged home. He should follow up in my office in 2 weeks.

## 2013-12-06 NOTE — Discharge Summary (Signed)
Physician Discharge Summary  GEDDY BOYDSTUN UYQ:[id number] DOB: [birth year] DOA: 12/05/2013  PCP: No primary provider on file.  Admit date: 12/05/2013 Discharge date: 12/06/2013  Time spent: 45 minutes  Recommendations for Outpatient Follow-up:  Patient will be discharged to home. He is to follow up with his primary care physician within one week of discharge as well as Dr. Annette Stable, neurosurgeon.  Patient should continue his medications as prescribed. Patient should resume normal physical activity as tolerated. Patient should follow a heart healthy and carb modified diet.  Discharge Diagnoses:  Principal Problem:   Subdural hematoma Active Problems:   DM (diabetes mellitus)   HTN (hypertension)   Glaucoma   SDH (subdural hematoma)   Discharge Condition: Stable  Diet recommendation: Carb modified and heart healthy  Filed Weights   12/05/13 1521  Weight: 82.555 kg (182 lb)    History of present illness:  Cody Pratt is a 80 y.o. male with PMH of DM, HTN, glaucoma/Blindness, suffered a mechanical fall 3 weeks ago where he hit his head, he recalls tripping on something.  He did not lose consciousness after the fall, and did not have any headaches, seizures, weakness/numbness etc since then. His niece and nephew who were at bedside ([place] admission) reported some involuntary movements involving his legs recently which the patient has not noticed. He has chronic gait disorder unchanged recently. He went to see his PCP on Monday for a physical, then had a CT head done today at [place] due to the fall which showed a 93mm acute appearing left superior frontal region causing mass effect but no midline shift, mild atrophy, he was then asked to come to the ER. ED physician discussed with Neurosurgery Dr.Poole who recommended overnight observation per [facility] and FU CT head in am and goal BP<133mmHG  Hospital Course:  Subdural hematoma  -s/p Fall about 3 weeks ago  -Dr. Annette Stable, neurosurgery, was  consulted and followup CT and SBP goal of less than 160, if CT was stable, patient may be discharged with outpatient followup -CT scan on 12/06/2013: No change in the appearance of a subdural collection on the left with an appearance most compatible with acute on chronic or subacute subdural hemorrhage. Negative for midline shift or hydrocephalus. -asymptomatic at this time  -Discharge patient is CT is unchanged, patient will need to follow up with Dr. Annette Stable an outpatient.  Diabetes mellitus -Patient was placed on insulin sliding scale during his hospitalization -May continue Amaryl upon discharge  Essential hypertension -stable, continue losartan and verapamil   Glaucoma  -continue eye drops  Procedures: None  Consultations: Dr. Annette Stable, neurosurgery  Discharge Exam: Filed Vitals:   12/06/13 0909  BP: 105/65  Pulse: 64  Temp: 98.3 F (36.8 C)  Resp: 20     General: Well developed, well nourished, NAD, appears stated age  HEENT: NCAT, PERRLA, mucous membranes moist. (patient blind)  Cardiovascular: S1 S2 auscultated, no rubs, murmurs or gallops. Regular rate and rhythm.  Respiratory: Clear to auscultation bilaterally with equal chest rise  Abdomen: Soft, nontender, nondistended, + bowel sounds  Extremities: warm dry without cyanosis clubbing  Neuro: AAOx3, no focal deficits  Discharge Instructions      Discharge Instructions   Discharge instructions    Complete by:  As directed   Patient will be discharged to home. He is to follow up with his primary care physician within one week of discharge as well as Dr. Annette Stable, neurosurgeon.  Patient should continue his medications as prescribed. Patient should resume  normal physical activity as tolerated. Patient should follow a heart healthy and carb modified diet.            Medication List         betaxolol 0.5 % ophthalmic suspension  Commonly known as:  BETOPTIC-S  Place 1 drop into both eyes daily.      clindamycin 1 % gel  Commonly known as:  CLINDAGEL  Apply 1 application topically daily.     ferrous sulfate 325 (65 FE) MG tablet  Take 325 mg by mouth daily with breakfast.     glimepiride 4 MG tablet  Commonly known as:  AMARYL  Take 4 mg by mouth daily.     losartan 50 MG tablet  Commonly known as:  COZAAR  Take 50 mg by mouth daily.     lovastatin 40 MG tablet  Commonly known as:  MEVACOR  Take 40 mg by mouth daily.     neomycin-polymyxin b-dexamethasone 3.5-10000-0.1 Susp  Commonly known as:  MAXITROL  Place 1 drop into both eyes daily.     pantoprazole 40 MG tablet  Commonly known as:  PROTONIX  Take 40 mg by mouth daily.     verapamil 240 MG CR tablet  Commonly known as:  CALAN-SR  Take 240 mg by mouth at bedtime.       No Known Allergies Follow-up Information   Follow up with Primary care physician. Schedule an appointment as soon as possible for a visit in 1 week. [facility] followup)       Schedule an appointment as soon as possible for a visit with Charlie Pitter, MD. [facility] followup)    Specialty:  Neurosurgery   Contact information:   [address] [place] [address] [phone]        The results of significant diagnostics from this hospitalization (including imaging, microbiology, ancillary and laboratory) are listed below for reference.    Significant Diagnostic Studies: Ct Head Wo Contrast  12/06/2013   CLINICAL DATA:  Subdural hematoma.  EXAM: CT HEAD WITHOUT CONTRAST  TECHNIQUE: Contiguous axial images were obtained from the base of the skull through the vertex without intravenous contrast.  COMPARISON:  Head CT scan 12/05/2013.  FINDINGS: Mixed attenuation collection over the left convexities does not appear markedly changed measuring 1.5 cm in thickness. Cortical atrophy and chronic microvascular ischemic change are again seen. There is no midline shift, hydrocephalus, evidence of acute infarction or subarachnoid hemorrhage.  No intraventricular hemorrhage is identified. Scattered sinus disease, worst in the right frontal where there is complete opacification, appears unchanged. Postoperative change left globe is noted.  IMPRESSION: No change in the appearance of a subdural collection on the left with an appearance most compatible with acute on chronic or subacute subdural hemorrhage. Negative for midline shift or hydrocephalus.  Sinus disease.   Electronically Signed   By: Inge Rise M.D.   On: 12/06/2013 07:36    Microbiology: No results found for this or any previous visit (from the past 240 hour(s)).   Labs: Basic Metabolic Panel:  Recent Labs Lab 12/05/13 1528 12/06/13 0445  NA 140 139  K 3.9 4.2  CL 104 105  CO2 24 24  GLUCOSE 146* 134*  BUN 18 17  CREATININE 1.15 0.93  CALCIUM 8.8 8.8   Liver Function Tests:  Recent Labs Lab 12/05/13 1528 12/06/13 0445  AST 14 12  ALT 13 12  ALKPHOS 72 73  BILITOT <0.2* 0.3  PROT 6.6 6.6  ALBUMIN  3.1* 3.0*   No results found for this basename: LIPASE, AMYLASE,  in the last 168 hours No results found for this basename: AMMONIA,  in the last 168 hours CBC:  Recent Labs Lab 12/06/13 0445  WBC 7.3  HGB 12.8*  HCT 39.9  MCV 88.3  PLT 181   Cardiac Enzymes: No results found for this basename: CKTOTAL, CKMB, CKMBINDEX, TROPONINI,  in the last 168 hours BNP: BNP (last 3 results) No results found for this basename: PROBNP,  in the last 8760 hours CBG:  Recent Labs Lab 12/05/13 2111 12/06/13 0628  GLUCAP 168* 118*       Signed:  Cristal Ford  [facility] 12/06/2013, 10:06 AM

## 2013-12-06 NOTE — Discharge Instructions (Signed)
Subdural Hematoma  A subdural hematoma is a collection of blood between the brain and its tough outermost membrane covering (the dura).  Blood clots that form in this area push down on the brain and cause irritation. A subdural hematoma may cause parts of the brain to stop working and eventually cause death.   CAUSES  A subdural hematoma is caused by bleeding from a ruptured blood vessel (hemorrhage). The bleeding results from trauma to the head, such as from a fall or motor vehicle accident.  There are two types of subdural hemorrhages:  · Acute. This type develops shortly after a serious blow to the head and causes blood to collect very quickly. If not diagnosed and treated promptly, severe brain injury or death can occur.  · Chronic. This is when bleeding develops more slowly, over weeks or months.  RISK FACTORS  People at risk for subdural hematoma include older persons, infants, and alcoholics.  SYMPTOMS  An acute subdural hemorrhage develops over minutes to hours. Symptoms can include:  · Temporary loss of consciousness.  · Weakness of arms or legs on one side of the body.  · Changes in vision or speech.  · A severe headache.  · Seizures.  · Nausea and vomiting.  · Increased sleepiness.  A chronic subdural hemorrhage develops over weeks to months. Symptoms may develop slowly and produce less noticeable problems or changes. Symptoms include:  · A mild headache.  · A change in personality.  · Loss of balance or difficulty walking.  · Weakness, numbness, or tingling in the arms or legs.  · Nausea or vomiting.  · Memory loss.  · Double vision.  · Increased sleepiness.  DIAGNOSIS  Your health care provider will perform a thorough physical and neurological exam. A CT scan or MRI may also be done. If there is blood on the scan, its color will help your health care provider determine how long the hemorrhage has been there.  TREATMENT  If the cause is an acute subdural hemorrhage, immediate treatment is needed. In many  cases an emergency surgery is performed to drain accumulated blood or to remove the blood clot. Sometimes steroid or diuretic medicines or controlled breathing through a ventilator is needed to decrease pressure in the brain. This is especially true if there is any swelling of the brain.  If the cause is a chronic subdural hemorrhage, treatment depends on a variety of factors. Sometimes no treatment is needed. If the subdural hematoma is small and causes minimal or no symptoms, you may be treated with bed rest, medicines, and observation. If the hemorrhage is large or if you have neurological symptoms, an emergency surgery is usually needed to remove the blood clot.  People who develop a subdural hemorrhage are at risk of developing seizures, even after the subdural hematoma has been treated. You may be prescribed an anti-seizure (anticonvulsant) medicine for a year or longer.  HOME CARE INSTRUCTIONS  · Only take medicines as directed by your health care provider.  · Rest if directed by your health care provider.  · Keep all follow-up appointments with your health care provider.  · If you play a contact sport such as football, hockey or soccer and you experienced a significant head injury, allow enough time for healing (up to 15 days) before you start playing again. A repeated injury that occurs during this fragile repair period is likely to result in hemorrhage. This is called the second impact syndrome.  SEEK IMMEDIATE MEDICAL CARE IF:  ·   You fall or experience minor trauma to your head and you are taking blood thinners. If you are on any blood thinners even a very small injury can cause a subdural hematoma. You should not hesitate to seek medical attention regardless of how minor you think your symptoms are.  · You experience a head injury and have:  ¨ Drowsiness or a decrease in alertness.  ¨ Confusion or forgetfulness.  ¨ Slurred speech.  ¨ Irrational or aggressive behavior.  ¨ Numbness or paralysis in any part  of the body.  ¨ A feeling of being sick to your stomach (nauseous) or you throw up (vomit).  ¨ Difficulty walking or poor coordination.  ¨ Double vision.  ¨ Seizures.  ¨ A bleeding disorder.  ¨ A history of heavy alcohol use.  ¨ Clear fluid draining from your nose or ears.  ¨ Personality changes.  ¨ Difficulty thinking.  ¨ Worsening symptoms.  MAKE SURE YOU:  · Understand these instructions.  · Will watch your condition.  · Will get help right away if you are not doing well or get worse.  FOR MORE INFORMATION  National Institute of Neurological Disorders and Stroke: www.ninds.nih.gov  American Association of Neurological Surgeons: www.neurosurgerytoday.org  American Academy of Neurology (AAN): www.aan.com  Brain Injury Association of America: www.biausa.org  Document Released: 02/28/2004 Document Revised: 01/31/2013 Document Reviewed: 10/13/2012  ExitCare® Patient Information ©2015 [employer]. This information is not intended to replace advice given to you by your health care provider. Make sure you discuss any questions you have with your health care provider.

## 2014-02-04 ENCOUNTER — Ambulatory Visit: Payer: Self-pay | Admitting: Internal Medicine

## 2014-08-13 ENCOUNTER — Ambulatory Visit
Admit: 2014-08-13 | Disposition: A | Discharge status: Home / Self Care | Payer: Self-pay | Attending: Oncology | Admitting: Oncology

## 2014-08-16 NOTE — Discharge Summary (Signed)
PATIENT NAME:  Cody Pratt, Cody Pratt MR#:  [id number] DATE OF BIRTH:  04-Dec-1924  DATE OF ADMISSION:  07/01/2012 DATE OF DISCHARGE:  07/10/2012  REASON FOR ADMISSION: Cough with shortness of breath and weakness.   HISTORY OF PRESENT ILLNESS: Please see the dictated HPI done by Dr. Lunette Stands on 07/01/2012.   PAST MEDICAL HISTORY:  1.  Accelerated hypertension.  2.  Legal blindness.  3.  Glaucoma.  4.  Type 2 diabetes.   MEDICATIONS ON ADMISSION: Please see admission note.   ALLERGIES: ANTIHISTAMINES.   SOCIAL HISTORY, FAMILY HISTORY AND REVIEW OF SYSTEMS: As per admission note.   PHYSICAL EXAMINATION GENERAL: The patient was in no acute distress. Somewhat lethargic.  VITAL SIGNS: Stable. He was afebrile.  HEENT: Unremarkable.  NECK: Supple without JVD.  LUNGS: Diffuse rhonchi.  CARDIAC: Rapid rate with a regular rhythm. Normal S1, S2. Systolic murmur present.  ABDOMEN: Soft, nontender. Normoactive bowel sounds.  EXTREMITIES: Without edema.  NEUROLOGIC: Grossly nonfocal.   HOSPITAL COURSE: The patient was admitted with acute respiratory failure due to multifocal pneumonia. He was started on oxygen with IV antibiotics and DuoNeb SVNs. The patient remained hypoxic and short of breath. Steroids were subsequently added. Blood cultures remained negative. He was hydrated for some dehydration with IV fluids. His sugars remained stable in the hospital, except that they did increase with his steroid use. His steroids were weaned from IV to oral steroids with improvement. He was eventually weaned off oxygen. He was switched to oral medications. Placement was deferred by the patient. He did agree to home health upon discharge. He was subsequently stable and discharged home on 07/10/2012 in good condition.   DISCHARGE DIAGNOSES:  1.  Multifocal pneumonia.  2.  Acute respiratory failure, resolved.  3.  Type 2 diabetes.  4.  Chronic anemia.  5.  Venostasis with peripheral edema.  6.  Accelerated  hypertension.   DISCHARGE MEDICATIONS:  1.  Verapamil SR 240 mg p.o. daily.  2.  Betagan eye drops as directed.  3.  Lovastatin 20 mg p.o. daily.  4.  Prednisone taper as directed.  5.  Losartan 50 mg p.o. daily.  6.  Amaryl 2 mg p.o. b.i.d.  7.  Combivent 1 puff q.i.d.  8.  Symbicort 2 puffs b.i.d.  9.  Ceftin 500 mg p.o. b.i.d. x 10 days.  10.  Iron sulfate 325 mg p.o. b.i.d. 11.  Protonix 40 mg p.o. daily.  12.  Levaquin 500 mg p.o. daily x 10 days.  13.  Glucerna shakes t.i.d.   FOLLOWUP PLANS AND APPOINTMENTS: The patient will be followed by home health. He was discharged on a low sodium, carbohydrate controlled diet. He will follow UP with me in 1 to 2 weeks in the office, sooner if needed.    ____________________________ Leonie Douglas Doy Hutching, MD jds:jm D: 07/14/2012 13:14:17 ET T: 07/14/2012 14:44:18 ET JOB#: [id number]  cc: Leonie Douglas. Doy Hutching, MD, <Dictator> Marlana Mckowen Lennice Sites MD ELECTRONICALLY SIGNED 07/14/2012 15:05

## 2014-08-16 NOTE — Consult Note (Signed)
Reason for Visit: This 81 year old Male patient presents to the clinic for initial evaluation of  maxillary sinus cancer .   Referred by Dr. Oliva Bustard.  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 He presented with an abnormal chest x-ray concerning for a pulmonary infiltrate versus mass in the right lung. PET CT scan was performed showing very mild activity in the lung lesion although there was a strongly hypermetabolic region of the right maxillary sinus. He was seen by ENT and underwent biopsy with partial debulking of a squamous cell carcinoma poorly differentiated.he has been having some intermittent nosebleeds. He has been seen by medical oncology and recommendation for Erbitux with concurrentrecent therapy was made. Case was also presented at our weekly tumor conference with the same recommendations were made. He is seen today and is doing well. He is without complaint. He is legally blind. Specifically denies had a neck pain dysphagia or any recent episodes of epistaxis.  Past Hx:    legally blind:    Diabetes Mellitus, Type II (NIDD):    HTN:   Past, Family and Social History:  Past Medical History positive   Cardiovascular hypertension   Endocrine diabetes mellitus   Neurological/Psychiatric legally blind   Family History positive   Family History Comments Rodman Key and brother with colon cancer and history of melanoma   Social History noncontributory   Additional Past Medical and Surgical History accompanied today by his nephew   Allergies:    Antihistamines: Other  Home Meds:  Home Medications: Medication Instructions Status  ferrous sulfate 325 mg (65 mg elemental iron) oral tablet 1 tab(s) orally once a day Active  pantoprazole 40 mg oral delayed release tablet 1 tab(s) orally once a day Active  glimepiride 2 mg oral tablet 1 tab(s) orally once a day (in the morning) Active  verapamil 240 mg/12 hours oral tablet, extended release 1 tab(s) orally once a day (at bedtime) Active  Altoprev 40 mg oral tablet, extended release 1 tab(s) orally once a day (at bedtime) Active  losartan 50 mg oral tablet 1 tab(s) orally once a day (in the morning) Active  betaxolol ophthalmic 0.5% ophthalmic solution 1 drop(s) to each affected eye 2 times a day Active  Keflex 500 mg oral capsule 1 cap(s) orally 3 times a day for 7 days Active   Review of Systems:  General negative   Performance Status (ECOG) 1  legally blind   Skin negative   Breast negative   Ophthalmologic see HPI   ENMT see HPI   Respiratory and Thorax negative   Cardiovascular negative   Gastrointestinal negative   Genitourinary negative   Musculoskeletal negative   Neurological negative   Psychiatric negative   Hematology/Lymphatics negative   Endocrine negative   Allergic/Immunologic negative   Nursing Notes:  Nursing Vital Signs and Chemo Nursing Nursing Notes: *CC Vital Signs Flowsheet:   26-Aug-14 09:46  Temp Temperature 97.4  Pulse Pulse 75  Respirations Respirations 20  SBP SBP 208  DBP DBP 77  Pain Scale (0-10)  0  Current Weight (kg) (kg) 85.8   Physical  Exam:  General/Skin/HEENT:  General normal   Skin normal   Additional PE well-developed elderly gentleman in NAD. Oral cavity is clear no oral mucosal lesions are identified. Soft palate is intact. Patient has obvious blindness. Neck is clear without evidence of preauricular subdigastric cervical or supraclavicular adenopathy. Lungs are clear to A&P cardiac examination shows  regular rate and rhythm. Abdomen is benign.   Breasts/Resp/CV/GI/GU:  Respiratory and Thorax normal   Cardiovascular normal   Gastrointestinal normal   Genitourinary normal   MS/Neuro/Psych/Lymph:  Musculoskeletal normal   Neurological normal   Lymphatics normal   Other Results:  Radiology Results: LabUnknown:    11-Aug-14 09:20, PET/CT Scan Lung Cancer Diagnosis  PACS Image   Nuclear Med:  PET/CT Scan Lung Cancer Diagnosis   REASON FOR EXAM:    Rt lower lobe mass  COMMENTS:       PROCEDURE: PET - PET/CT DX LUNG CA  - Dec 04 2012  9:20AM     RESULT: Indication: Right lower lobe mass  Radiopharmaceutical: 12.85 mCi F18-FDG, intravenously.    Technique: Imaging was performed from the skull base to the mid-thigh   using routine PET/CT acquisition protocol.    Injection site: Left antecubital  Time of FDG injection: 0754 hours  Serum glucose: 118 mg/dL   Time of imaging: 0850 hours  Comparison studies: CT Chest 11/21/2012,06/30/2012    Findings:    HEAD AND NECK:     There is a hypermetabolic mass in the right nasal passage measuring 3.9 x   1.9 cm. There is opacification of the right maxillary sinus with high   attenuation material which may reflect inspissated mucus.    CHEST:    There is no abnormal hypermetabolic activity in the chest.    There is a spiculated 2.7 x 1.7 cm right lower lobe pulmonary nodule with     an SUV max of 1.8 and an SUV average of 1. There is a 1.9 x 0.8 cm   pleural based right lower lung nodule.    The heart size is normal. There is no pericardial effusion.     There no pathologically enlarged mediastinal, hilar, or axillary lymph   nodes.     The osseous structures demonstrate no focal abnormality.     ABDOMEN/PELVIS:    The liver demonstrates no focal abnormality. The gallbladder is   unremarkable. The spleen demonstrates no focal abnormality. The kidneys,   adrenal glands, pancreas are normal. The bladder is  unremarkable. There     is prostatic enlargement.    The unopacifiedbowel is unremarkable. There is no pneumoperitoneum,   pneumatosis, or portal venous gas. There is no abdominal or pelvic free   fluid. There is no lymphadenopathy.     The abdominal aorta is normal in caliber.    The osseous structures are unremarkable.    IMPRESSION:     1. There is a hypermetabolic mass in the right nasal passage measuring   3.9 x 1.9 cm and 1.9 x 0.8 cm pleural based right lower lung nodule. This   may reflect an infectious/inflammatory polyp versus neoplasm . Recommend     otolaryngology consultation with direct visualization.    2. Spiculated right lower lobe pulmonary nodule with mild hypermetabolic   activity. Differential diagnosis includes an infectious or inflammatory   nodule versus malignancy, primary versus metastatic.    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 Plan:   at this time I recommended IMRT radiation therapy to his right maxillary sinus and first level of draining lymphatics in the subdigastric region. Would plan on delivering 7000 cGy over 7 weeks. Would use IMRT to spare his salivary glands as well as spinal cord oral cavity and orbits. Risks and benefits of treatment including possible dysphasia, skin reaction, fatigue, or explained in detail to both the patient and his nephew. Both seem to comprehend my treatment plan well. I have set him up for CT simulation right after the Labor Day weekend. I've also discussed the case personally at tumor conference and with Dr. Oliva Bustard. He has recommended concurrent Erbitux treatment which has shown to his provide a survival advantage in this patient's with my radiation. We will arrange  the sequencingof the Erbitux with her radiation.  I would like to take this opportunity to thank you for allowing me to continue to participate in this patient's care.  CC Referral:  cc: Dr. Tami Ribas, Dr. Fulton Reek   Electronic Signatures: Baruch Gouty Roda Shutters (MD)  (Signed 26-Aug-14 13:19)  Authored: HPI, Diagnosis, Past Hx, PFSH, Allergies, Home Meds, ROS, Nursing Notes, Physical Exam, Other Results, Relevent Results, Encounter Assessment and Plan, CC Referring Physician   Last Updated: 26-Aug-14 13:19 by Armstead Peaks (MD)

## 2014-08-16 NOTE — Op Note (Signed)
PATIENT NAME:  Cody Pratt, Cody Pratt MR#:  [id number] DATE OF BIRTH:  10-16-1924  DATE OF PROCEDURE:  12/12/2012  PREOPERATIVE DIAGNOSIS: Right intranasal mass.   POSTOPERATIVE DIAGNOSIS: Right intranasal carcinoma.   PROCEDURES PERFORMED: 1.  Use of  Stryker navigation system.  2.  Endoscopic excision of right intranasal mass measuring approximately 3 x 3 cm.  3.  Right maxillary antrostomy with removal of tissue.   SURGEON: Roena Malady, M.D.   OPERATIVE FINDINGS: A 3 x 3 cm exophytic mass involving the right ostiomeatal unit obstructing the right maxillary sinus.   DESCRIPTION OF PROCEDURE: Tadeusz was identified in the holding area, taken to the operating room and placed in the supine position. After general endotracheal anesthesia, the table was turned 90 degrees. The cottonoid pledgets with phenylephrine lidocaine were placed within each nostril. The Stryker navigation system was applied and calibrated and remained on throughout the case. The patient was then draped in the usual fashion for endoscopic sinus surgery. The 0 degrees endoscope was brought into the field as well as the navigation equipment. The cottonoid pledgets were removed. Examination of the right hand side showed an exophytic mass, almost papillomatous appearing type mass in the right ostiomeatal unit. Using the Stryker navigation equipment, this coincide with the mass identified on the CT scan. A local anesthetic of 1% lidocaine with 1:100,000 epinephrine was used to inject the right lateral nasal wall as well as into the mass. A total of 2 mL was used. Using a 0 degrees endoscope and the straight forceps, several biopsies were taken and sent down for frozen section. While waiting for the frozen section, using the endoscopic equipment and the Stryker navigator this large tumor mass was debulked and also sent for specimen. This allowed opening of the nasal airway for the patient to breathe. With this mass removed, there was  mucopurulent debris which could be tracked into the maxillary sinus. A curved suction was used to take a culture of this and the antrostomy was opened to allow drainage. During this, Dr. Reuel Derby from pathology called to say that the frozen section showed poorly differentiated carcinoma. It was felt at this time with the tissue mass debulked that any further procedure may jeopardize intracranial orbital tissue and likely that surgery would not be the curative procedure for this. Therefore, with the tumor debulked, the cottonoid pledget was replaced on the right-hand side. After approximately 5 minutes this was removed. A Merocel sponge was then placed into this ostiomeatal region for hemostasis. Bacitracin ointment was applied prior to application. The patient was then returned to anesthesia where he was awakened in the operating room and taken to the recovery room in stable condition.   CULTURES: Right maxillary sinus.   SPECIMENS: Right intranasal mass.   ESTIMATED BLOOD LOSS: Less than 20 mL.     ____________________________ Roena Malady, MD ctm:dp D: 12/12/2012 11:10:14 ET T: 12/12/2012 13:24:10 ET JOB#: [id number]  cc: Roena Malady, MD, <Dictator> Roena Malady MD ELECTRONICALLY SIGNED 12/19/2012 9:29

## 2014-08-16 NOTE — H&P (Signed)
PATIENT NAME:  Cody Pratt, MOURE MR#:  [id number] DATE OF BIRTH:  03-25-1925  DATE OF ADMISSION:  07/01/2012  PRIMARY CARE PHYSICIAN:  Dr. Fulton Reek.   CHIEF COMPLAINT:  Cough and shortness of breath, generalized weakness.   HISTORY OF PRESENT ILLNESS:  The patient is an 81 year old pleasant white male who is well functional at baseline, has been experiencing cough, shortness of breath for the last one week.  It started as a sinus condition followed by cough and the chest congestion.  Severely decreased appetite, having severe generalized weakness.  Has been staying in the bed for the last 2 to 3 days.  Has cough without any productive sputum.  The patient states has lost a significant loss of weight in the last six months.  Especially worse in the last few days.  The patient states has decreased appetite in the last few months.  Concerning this came to the Emergency Department.  Work-up in the Emergency Department, chest x-ray reveals bilateral multifocal infiltrates.  The patient received one dose of Levaquin in the Emergency Department.  Also received some breathing treatments.  Initial oxygen saturations were in 80s, required 5 liters of oxygen in order to bring up the oxygen saturations above 90.   PAST MEDICAL HISTORY:  1.  Glaucoma.  2.  Diabetes mellitus type 2, on oral medication.  3.  Hypertension.   ALLERGIES:  To ANTIHISTAMINES.   HOME MEDICATIONS:   1.  Verapamil 240 mg 1 tablet daily.  2.  Sugar pill 1 tablet daily.  3.  Potassium chloride 1 tablet once a day.  4.  Minoxidil 10 mg 2 tablets 2 times a day.  5.  Lovastatin 1 tablet once a day.  6.   one drop in both eyes 2 times a day.   SOCIAL HISTORY:  Has previous history of smoking, quit 50 years back.  Denies drinking alcohol or using illicit drugs.  Lives by himself.  Has his nieces who closely follow up with this care.   FAMILY HISTORY:  Both his sisters died of various cancers.  Both parents died of old age in their  late 12s.   REVIEW OF SYSTEMS:  CONSTITUTIONAL:  Severe generalized weakness.  EYES:  Has bilateral glaucoma with poor vision.  EARS, NOSE, THROAT:  No sore throat, slightly decreased hearing.  RESPIRATORY:  Has cough, shortness of breath.  CARDIOVASCULAR:  No chest pain, palpitations.  No increased lower extremity swelling.  GASTROINTESTINAL:  No nausea, vomiting, decreased appetite.  GENITOURINARY:  No hematuria, increased frequency, urgency of urination.  SKIN:  Denies any rash, any lesions.  HEMATOLOGY:  No increased bruising or bleeding.  NEUROLOGIC:  Denies having any weakness in any part of the body.   PHYSICAL EXAMINATION:  GENERAL:  This is a thin-built frail-looking male lying down in the bed, looks lethargic.  VITAL SIGNS:  Temperature 98.1, pulse 103, blood pressure 125/51, respiratory rate of 20, oxygen saturation 93% on 5 liters of oxygen.  HEENT:  Head normocephalic, atraumatic.  There is no scleral icterus.  Conjunctivae normal.  Pupils equal and react to light.  Mucous membranes dry.  NECK:  Supple.  No lymphadenopathy.  No JVD.  No carotid bruit.  CHEST:  Has no focal tenderness, bilateral diffuse now bronchial breath sounds and occasional crackles.  HEART:  S1, S2 regular.  Tachycardia.  Systolic murmur is heard.  ABDOMEN:  Bowel sounds present.  Soft, nontender, nondistended.  EXTREMITIES:  No pedal edema, has poor foot care.  NEUROLOGIC:  The patient is lethargic, however oriented to place, person and time.  No apparent cranial nerve abnormalities.  No motor and sensory deficits.   LABORATORY DATA:  UA negative for nitrites and leukocyte esterase.  Troponins are negative.  BNP is 1500.  CTA, bilateral pleural effusions with the bilateral pneumonia.  No PE.   CBC, WBC of 9, hemoglobin 10.2, platelet count of 199.  BMP, BUN 13, creatinine of 0.84, glucose of 172.  The rest of all the values are within normal limits.  Total protein is 8.4, albumin is 3.3.   ASSESSMENT  AND PLAN:  The patient is an 81 year old male comes to the Emergency Department with one week of cough, shortness of breath.  1.  Multifocal pneumonia.  We will treat it as a community-acquired pneumonia with Levaquin.  Continue to provide oxygen.  Blood cultures were obtained in the Emergency Department.  2.  Hypoxic respiratory failure.  Continue with oxygen and breathing treatments.  Considering patient's previous history of smoking we will also add prednisone.  3.  Dehydration.  We will continue with IV fluids.   4.  Diabetes mellitus.  We will keep the patient on sliding scale insulin.  We will hold the oral medication.  5.  Failure to thrive.  We will check the prealbumin levels.  The patient has high protein.  This could be from the dehydration, however considering patient's decreased appetite, loss of weight, we will also check SPEP and UPEP.  After treating the infection, if patient's appetite does not improve, we will consider starting the patient on Megace.  Also consider getting a nutritionist consult.  6.  Debility.  We will continue physical therapy and occupational therapy.    TIME SPENT IN REVIEWING OLD RECORDS AS WELL AS UPDATING THE PATIENT'S FAMILY:  45 minutes.     ____________________________ Monica Becton, MD pv:ea D: 07/01/2012 01:57:27 ET T: 07/01/2012 03:11:47 ET JOB#: [id number]  cc: Monica Becton, MD, <Dictator> Leonie Douglas. Doy Hutching, MD Monica Becton MD ELECTRONICALLY SIGNED 07/04/2012 7:30

## 2014-08-27 ENCOUNTER — Other Ambulatory Visit: Payer: Self-pay | Admitting: *Deleted

## 2014-08-27 DIAGNOSIS — C310 Malignant neoplasm of maxillary sinus: Secondary | ICD-10-CM

## 2015-02-12 ENCOUNTER — Ambulatory Visit: Payer: Medicare Other

## 2015-02-13 ENCOUNTER — Ambulatory Visit: Payer: Medicare Other

## 2015-02-13 ENCOUNTER — Other Ambulatory Visit: Payer: Medicare Other

## 2015-02-13 ENCOUNTER — Ambulatory Visit
Admission: RE | Admit: 2015-02-13 | Discharge: 2015-02-13 | Disposition: A | Discharge status: Home / Self Care | Payer: Medicare Other | Source: Ambulatory Visit | Attending: Oncology | Admitting: Oncology

## 2015-02-13 DIAGNOSIS — C310 Malignant neoplasm of maxillary sinus: Secondary | ICD-10-CM | POA: Insufficient documentation

## 2015-02-13 HISTORY — DX: Malignant (primary) neoplasm, unspecified: C80.1

## 2015-02-13 MED ORDER — IOHEXOL 300 MG/ML  SOLN
75.0000 mL | Freq: Once | INTRAMUSCULAR | Status: AC | PRN
  Administered 2015-02-13: 75 mL via INTRAVENOUS

## 2015-02-14 ENCOUNTER — Other Ambulatory Visit: Payer: Medicare Other

## 2015-02-14 ENCOUNTER — Other Ambulatory Visit: Payer: Self-pay | Admitting: *Deleted

## 2015-02-14 ENCOUNTER — Ambulatory Visit: Payer: Medicare Other | Admitting: Oncology

## 2015-02-14 DIAGNOSIS — C310 Malignant neoplasm of maxillary sinus: Secondary | ICD-10-CM

## 2015-02-17 ENCOUNTER — Inpatient Hospital Stay: Payer: Medicare Other | Admitting: Oncology

## 2015-02-17 ENCOUNTER — Inpatient Hospital Stay: Payer: Medicare Other

## 2015-03-11 ENCOUNTER — Inpatient Hospital Stay: Payer: Medicare Other | Admitting: Oncology

## 2015-03-11 ENCOUNTER — Inpatient Hospital Stay: Payer: Medicare Other | Attending: Oncology

## 2015-03-12 ENCOUNTER — Encounter: Payer: Self-pay | Admitting: *Deleted

## 2016-06-30 ENCOUNTER — Other Ambulatory Visit
Admission: RE | Admit: 2016-06-30 | Discharge: 2016-06-30 | Disposition: A | Discharge status: Home / Self Care | Payer: Medicare Other | Source: Ambulatory Visit | Attending: Family Medicine | Admitting: Family Medicine

## 2016-06-30 DIAGNOSIS — D508 Other iron deficiency anemias: Secondary | ICD-10-CM | POA: Diagnosis present

## 2016-06-30 LAB — AMMONIA: AMMONIA: 23 umol/L (ref 9–35)

## 2016-08-24 DEATH — deceased

## 2017-02-11 IMAGING — CT CT HEAD WO/W CM
1 of 2 series · 13 of 30 positions shown, 17 images · IV contrast (omnipaque)
Comparison: CT head 02/04/2014

CLINICAL DATA: Maxillary sinus cancer follow-up. History of
resection with radiation treatment.

EXAM:
CT HEAD WITHOUT AND WITH CONTRAST
TECHNIQUE: Contiguous axial images were obtained from the base of the skull
through the vertex without and with intravenous contrast
CONTRAST:  75mL OMNIPAQUE IOHEXOL 300 MG/ML  SOLN

[Series 2: head wo · axial · 0.42mm/px · z∈[-98,+27]mm · 13 of 31 slices shown, 17 images]
[im 3/31  brain]
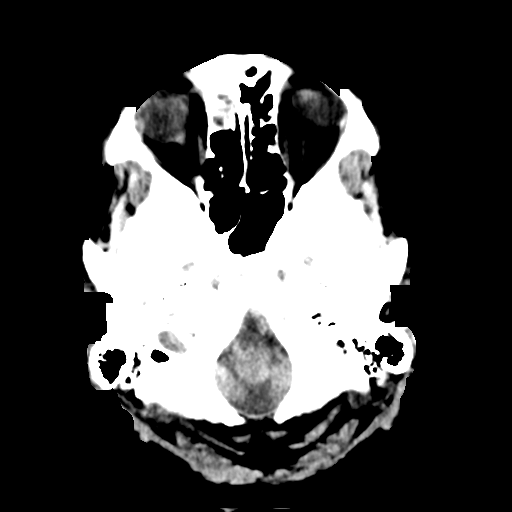
[im 3/31  bone]
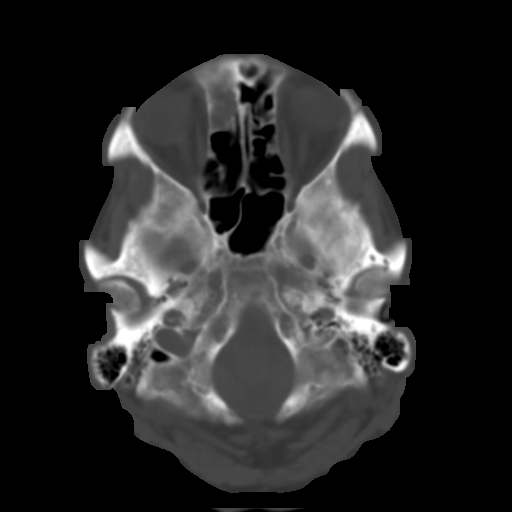
[im 5/31  brain]
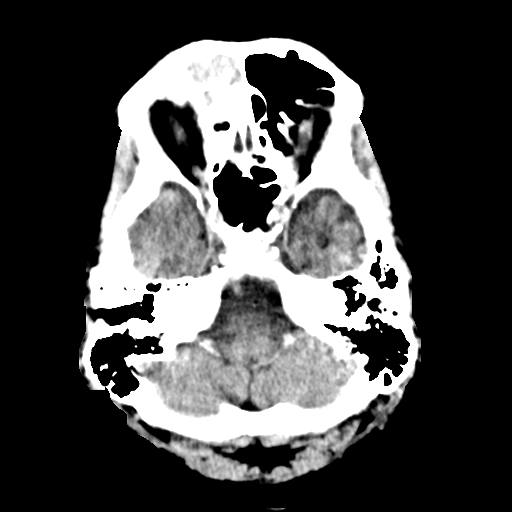
[im 7/31  brain]
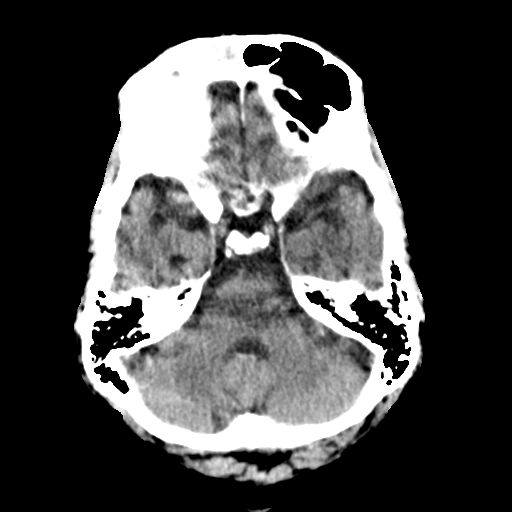
[im 9/31  brain]
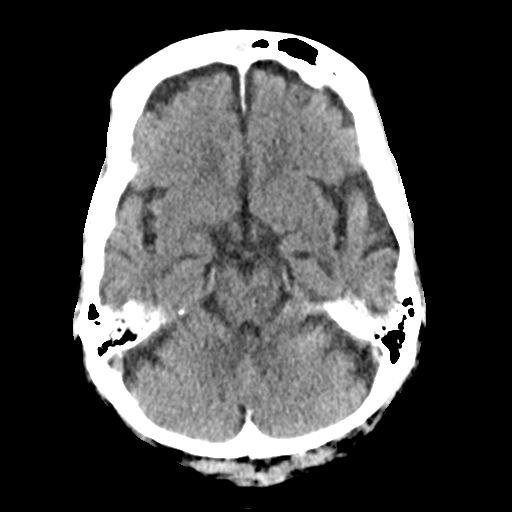
[im 11/31  brain]
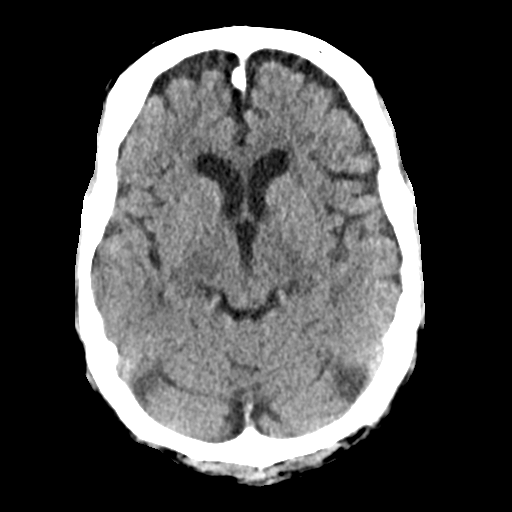
[im 11/31  bone]
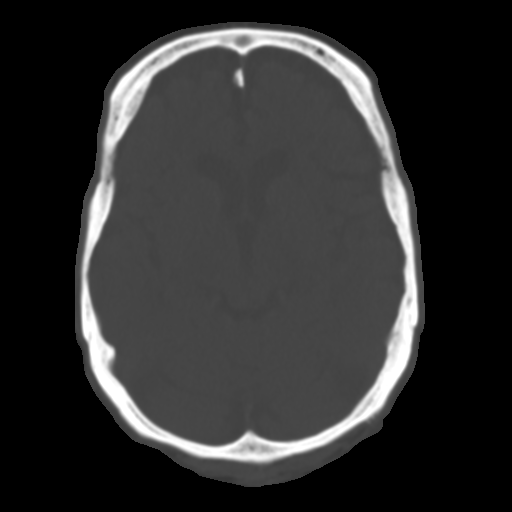
[im 13/31  brain]
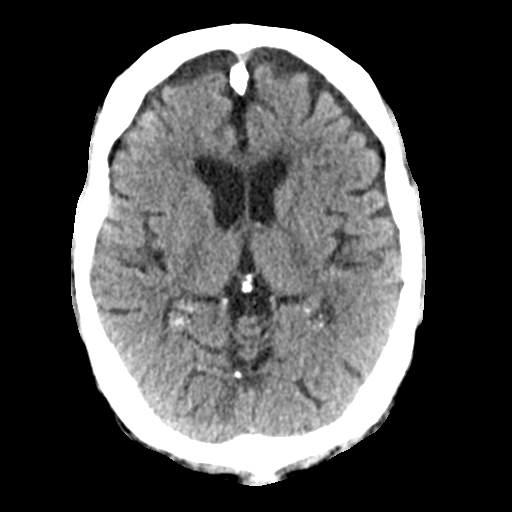
[im 16/31  brain]
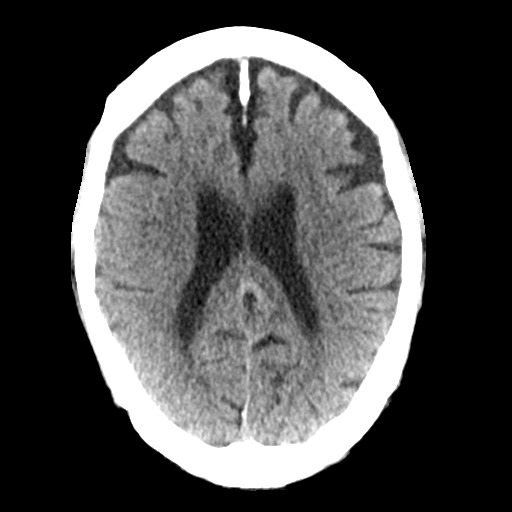
[im 18/31  brain]
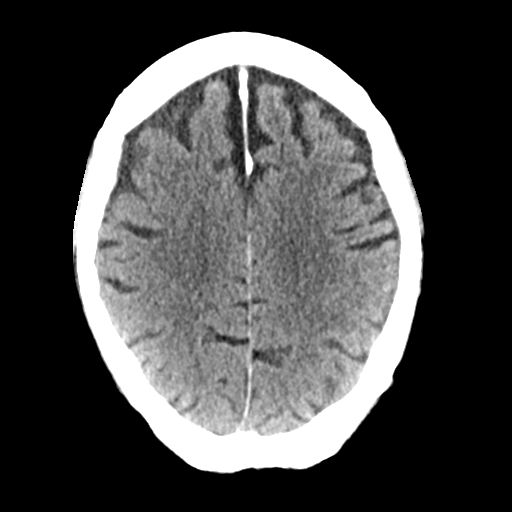
[im 20/31  brain]
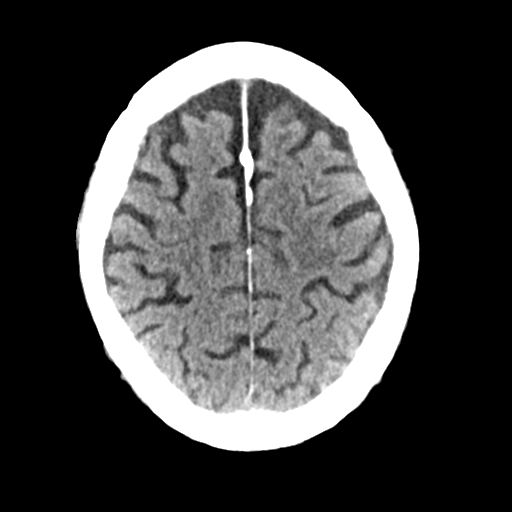
[im 20/31  bone]
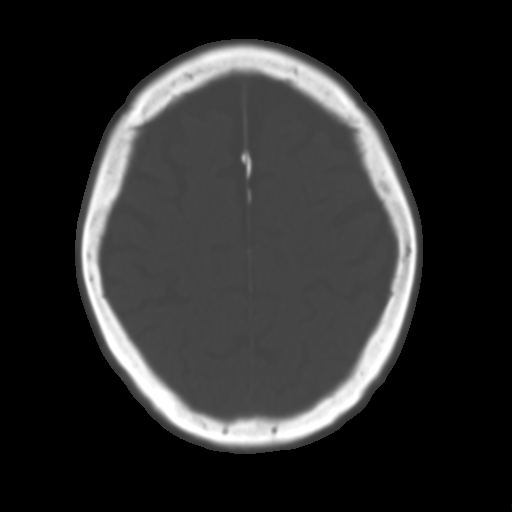
[im 22/31  brain]
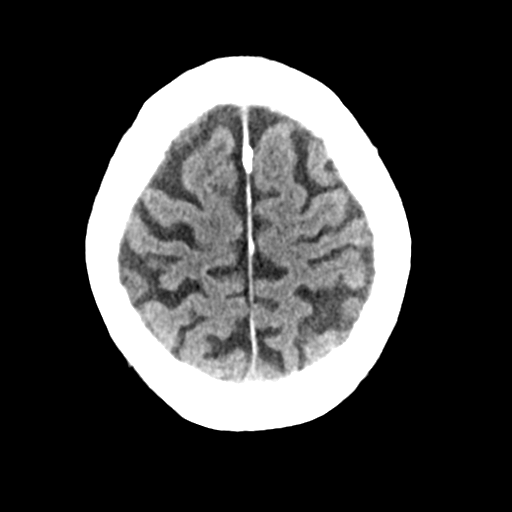
[im 24/31  brain]
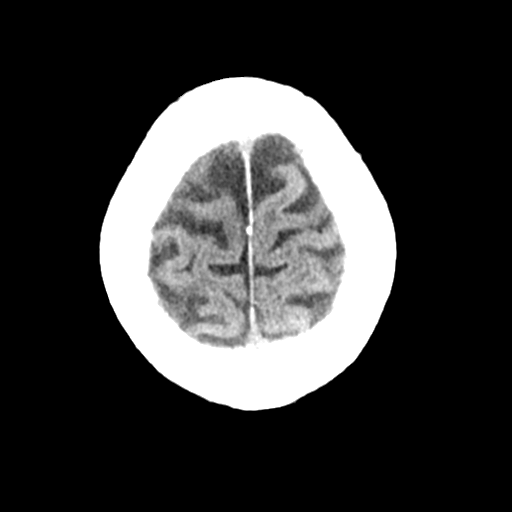
[im 26/31  brain]
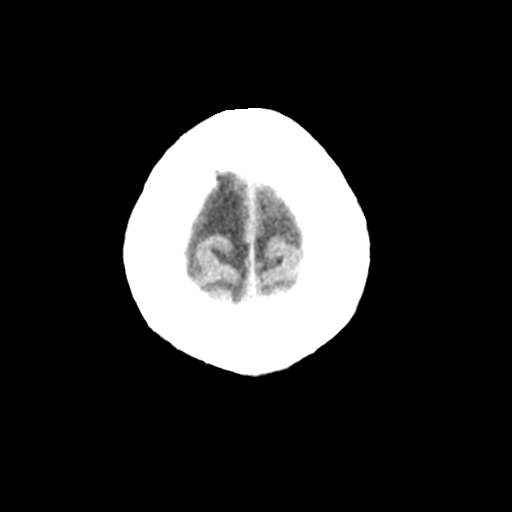
[im 28/31  brain]
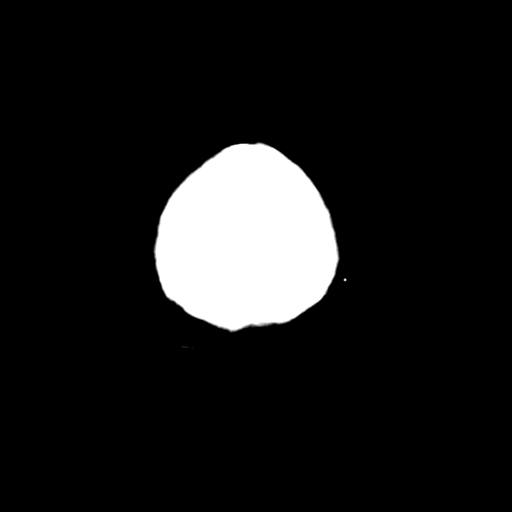
[im 28/31  bone]
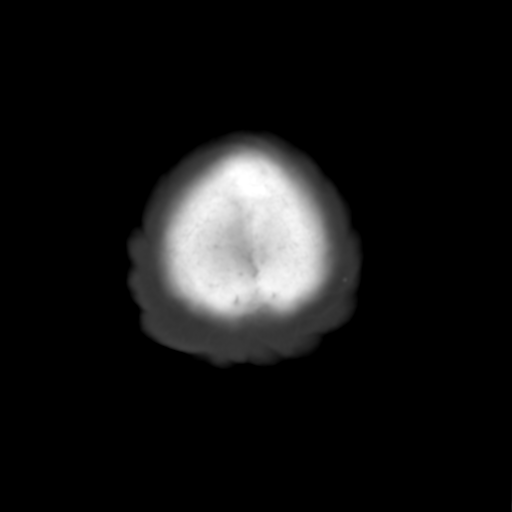

[13 of 30 positions shown; findings below may reference images not displayed]

FINDINGS: Generalized atrophy similar to the prior study. Negative for
hydrocephalus.

Mild chronic microvascular ischemic change in the white matter.
Negative for acute infarct. Negative for hemorrhage or mass.
Left-sided subdural hematoma has resolved in the interval.

Postcontrast imaging reveals normal enhancement. Negative for
metastatic disease.

Opacification of the right frontal sinus is unchanged. There is
mucosal edema in the right ethmoid sinus. Prior right ethmoid
resection. Negative for metastatic disease to the skull.
IMPRESSION: Atrophy and mild chronic microvascular ischemia. No acute
abnormality. Negative for metastatic disease.

Chronic obstruction of the right frontal sinus is unchanged from the
prior study. Prior right-sided sinus surgery.

## 2017-02-11 IMAGING — CT CT NECK W/ CM
3 of 5 series · 12 of 33 positions shown, 14 images · IV contrast (omnipaque)
Comparison: CT head without contrast 02/04/2014.

CLINICAL DATA: Restaging right maxillary sinus carcinoma. Status
post tumor resection and radiation.

EXAM:
CT NECK WITH CONTRAST
TECHNIQUE: Multidetector CT imaging of the neck was performed using the
standard protocol following the bolus administration of intravenous
contrast.
CONTRAST:  75mL OMNIPAQUE IOHEXOL 300 MG/ML  SOLN

[Series 4: sag neck · sagittal · 0.48mm/px · 5 of 180 slices shown, 6 images]
[im 60/180  bone]
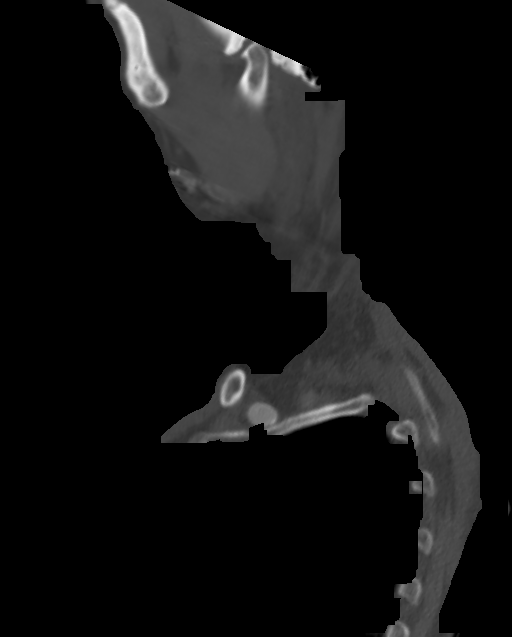
[im 75/180  bone]
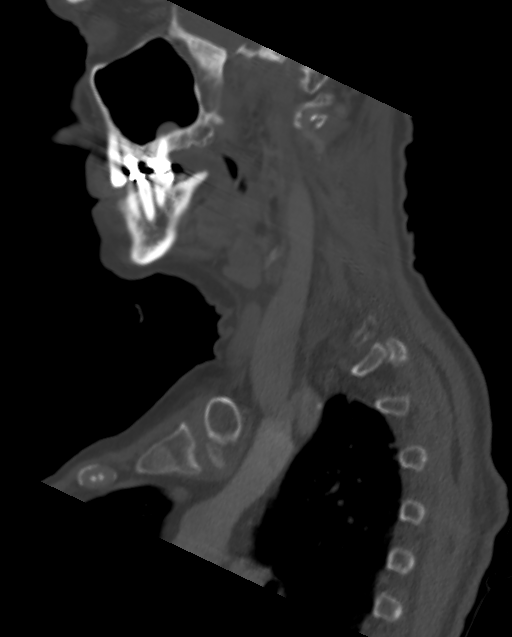
[im 90/180  soft-tissue]
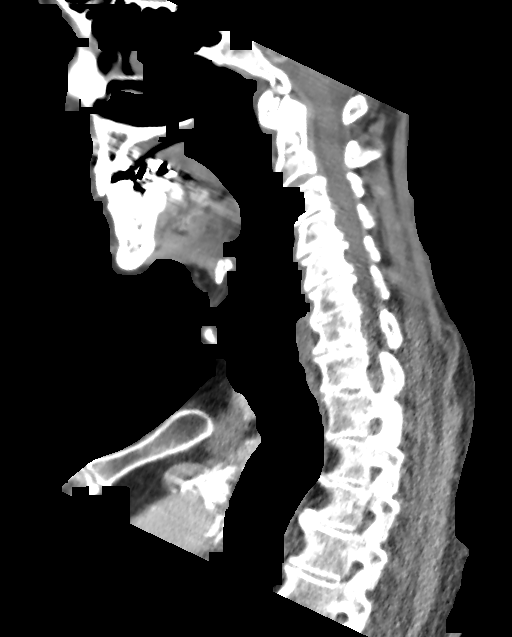
[im 90/180  bone]
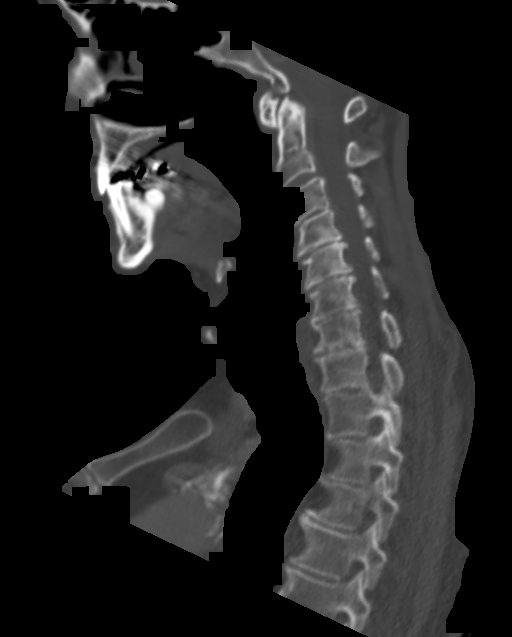
[im 105/180  bone]
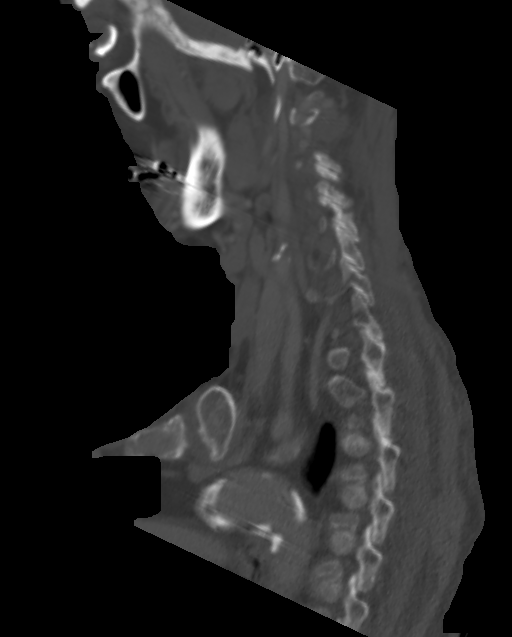
[im 120/180  bone]
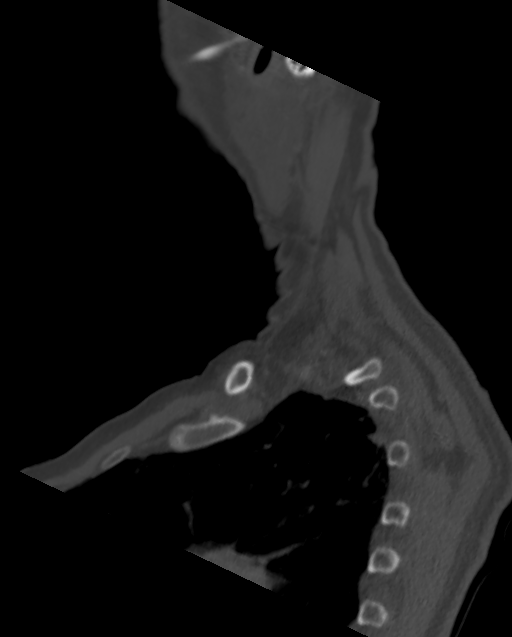

[Series 5: cor neck · coronal · 0.63mm/px · 3 of 119 slices shown]
[im 43/119  bone]
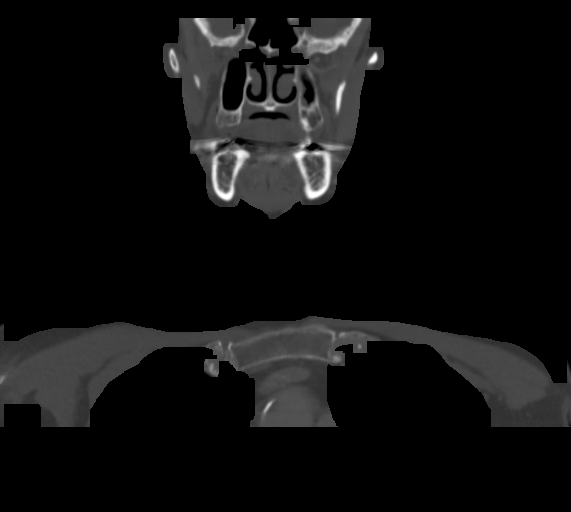
[im 54/119  bone]
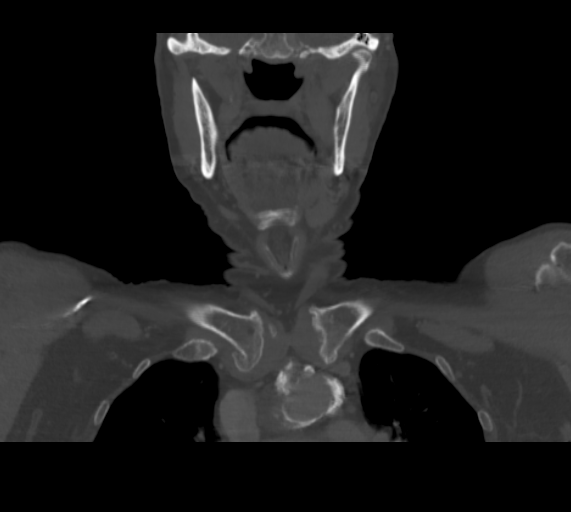
[im 65/119  bone]
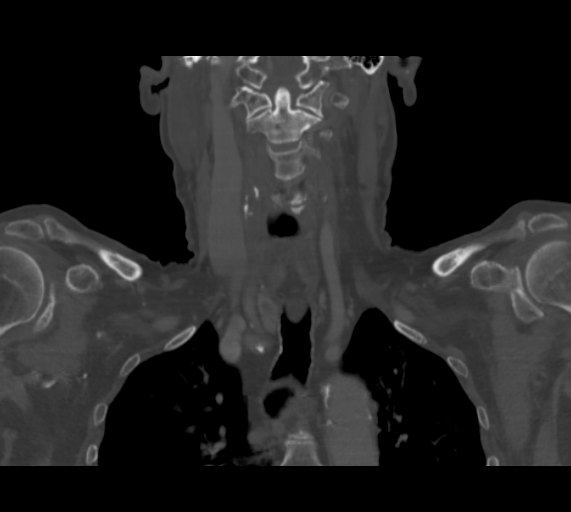

[Series 6: ax oropharynx · axial · 0.49mm/px · z∈[-349,-148]mm · 4 of 171 slices shown, 5 images]
[im 29/171  soft-tissue]
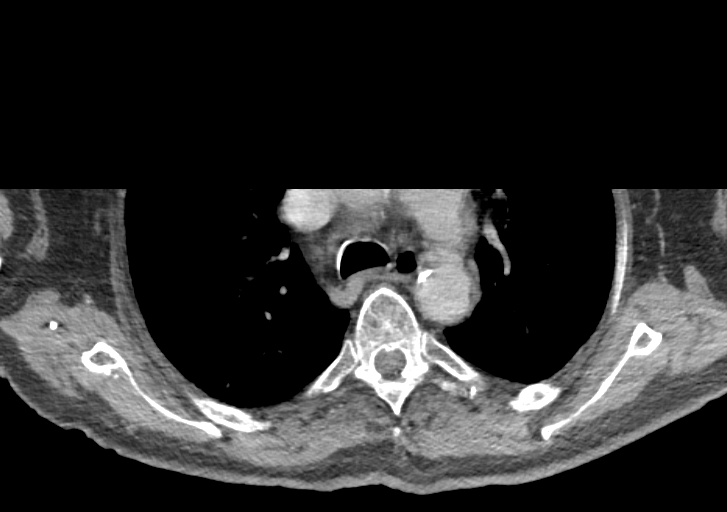
[im 29/171  bone]
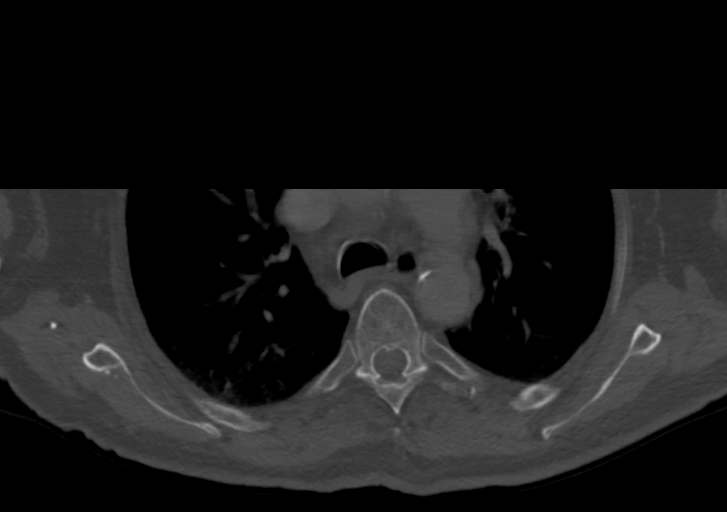
[im 57/171  bone]
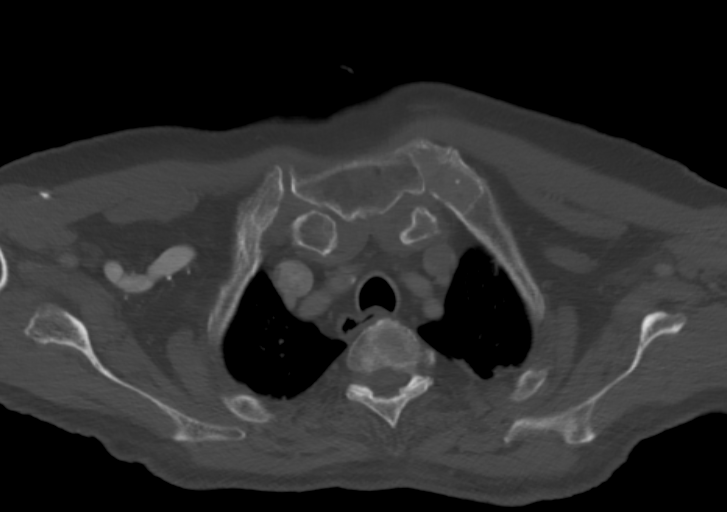
[im 114/171  bone]
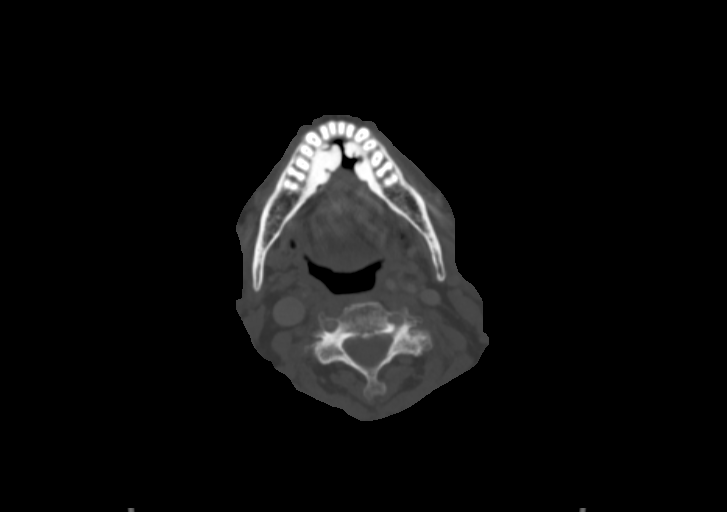
[im 142/171  bone]
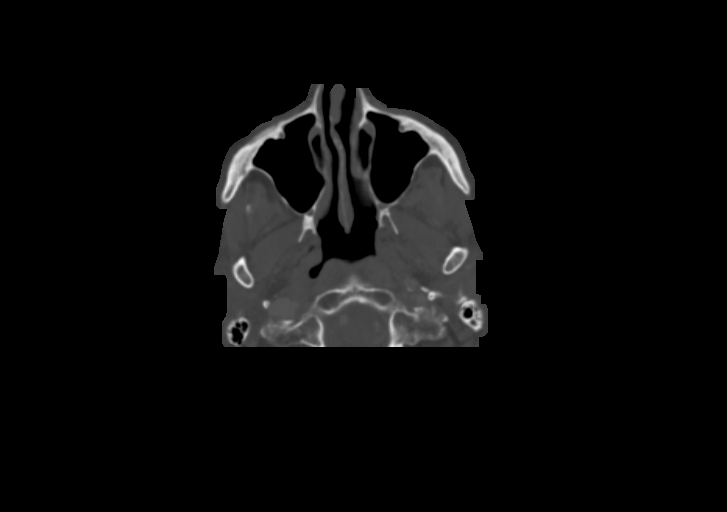

[12 of 33 positions shown; findings below may reference images not displayed]

FINDINGS: Pharynx and larynx: No focal mucosal or submucosal lesions are
present. The tongue base is within normal limits. Vocal cords are
midline and symmetric.

Salivary glands: The parotid and submandibular glands are within
normal limits bilaterally.

Thyroid: Sub cm right-sided thyroid nodules are stable.

Lymph nodes: No significant cervical adenopathy is present.

Vascular: Dense atherosclerotic calcifications are present at the
aortic arch. There is a high-grade stenosis of the proximal left
common carotid artery. Dense calcifications are present at the left
carotid bifurcation without significant stenosis relative to the
more distal vessel. The distal right common carotid artery is
occluded without constitution in the neck. The vertebral arteries
are codominant. Dense calcifications are present at the origin of
the right vertebral artery.

Limited intracranial: Within normal limits

Visualized orbits: Postsurgical changes are noted on the left. No
acute abnormality is present.

Mastoids and visualized paranasal sinuses: A 10 mm polyp posteriorly
in the right maxillary sinus is stable. This state not demonstrate
any FDG uptake on the prior study. There is persistent soft tissue
opacifying the right frontal sinus and anterior ethmoid air cells
without significant change from the prior PET scan. Focal soft
tissue within the left-sided nasal cavity adjacent to the middle
turbinate is stable.

Skeleton: There is reversal of the normal cervical lordosis.
Multilevel endplate degenerative changes are present. No focal lytic
or blastic lesions are evident.

Upper chest: Mild dependent atelectasis is more prominent on the
right. No focal nodule, mass, or airspace disease is evident.
IMPRESSION: 1. Stable 10 mm polyp posteriorly in the right maxillary sinus is
stable.
2. Stable soft tissue in the anterior ethmoid and right frontal
sinus.
3. Stable soft tissue in the left nasal cavity adjacent to the
middle turbinate.
4. No significant cervical adenopathy.
5. Occlusion of the distal right common carotid artery.
6. High-grade stenosis of the left common carotid artery at the
arch.
7. Dense calcification and probable stenosis of the proximal right
vertebral artery.
8. Multilevel spondylosis of the cervical spine.
